# Patient Record
Sex: Female | Born: 1980
Health system: Southern US, Community
[De-identification: ages and names within clinical notes are randomized; demographics above are authoritative.]

## PROBLEM LIST (undated history)

## (undated) DIAGNOSIS — I10 Essential (primary) hypertension: Secondary | ICD-10-CM

## (undated) DIAGNOSIS — R1900 Intra-abdominal and pelvic swelling, mass and lump, unspecified site: Secondary | ICD-10-CM

## (undated) DIAGNOSIS — K760 Fatty (change of) liver, not elsewhere classified: Secondary | ICD-10-CM

## (undated) HISTORY — DX: Intra-abdominal and pelvic swelling, mass and lump, unspecified site: R19.00

## (undated) HISTORY — PX: DIAGNOSTIC LAPAROSCOPY: SUR761

## (undated) HISTORY — DX: Essential (primary) hypertension: I10

## (undated) HISTORY — DX: Fatty (change of) liver, not elsewhere classified: K76.0

---

## 2015-02-08 DIAGNOSIS — R1900 Intra-abdominal and pelvic swelling, mass and lump, unspecified site: Secondary | ICD-10-CM

## 2015-02-08 HISTORY — DX: Intra-abdominal and pelvic swelling, mass and lump, unspecified site: R19.00

## 2015-02-08 LAB — HM PAP SMEAR: HM Pap smear: NORMAL

## 2016-10-17 DIAGNOSIS — N390 Urinary tract infection, site not specified: Secondary | ICD-10-CM | POA: Diagnosis not present

## 2016-10-28 ENCOUNTER — Encounter: Payer: Self-pay | Admitting: Family

## 2016-10-28 ENCOUNTER — Ambulatory Visit (INDEPENDENT_AMBULATORY_CARE_PROVIDER_SITE_OTHER): Payer: 59 | Admitting: Family

## 2016-10-28 VITALS — BP 119/78 | HR 62 | Temp 98.2°F | Resp 16 | Ht <= 58 in | Wt 150.8 lb

## 2016-10-28 DIAGNOSIS — I10 Essential (primary) hypertension: Secondary | ICD-10-CM

## 2016-10-28 DIAGNOSIS — Z0000 Encounter for general adult medical examination without abnormal findings: Secondary | ICD-10-CM | POA: Diagnosis not present

## 2016-10-28 DIAGNOSIS — L309 Dermatitis, unspecified: Secondary | ICD-10-CM | POA: Insufficient documentation

## 2016-10-28 HISTORY — DX: Essential (primary) hypertension: I10

## 2016-10-28 HISTORY — DX: Dermatitis, unspecified: L30.9

## 2016-10-28 LAB — LIPID PANEL
CHOL/HDL RATIO: 3
Cholesterol: 196 mg/dL (ref 0–200)
HDL: 67.4 mg/dL (ref >39.00)
LDL CALC: 102 mg/dL — AB (ref 0–99)
NonHDL: 128.34
TRIGLYCERIDES: 130 mg/dL (ref 0.0–149.0)
VLDL: 26 mg/dL (ref 0.0–40.0)

## 2016-10-28 LAB — HEPATIC FUNCTION PANEL
ALT: 53 U/L — ABNORMAL HIGH (ref 0–35)
AST: 37 U/L (ref 0–37)
Albumin: 4.4 g/dL (ref 3.5–5.2)
Alkaline Phosphatase: 81 U/L (ref 39–117)
BILIRUBIN DIRECT: 0.1 mg/dL (ref 0.0–0.3)
BILIRUBIN TOTAL: 0.5 mg/dL (ref 0.2–1.2)
Total Protein: 7.8 g/dL (ref 6.0–8.3)

## 2016-10-28 LAB — URINALYSIS, ROUTINE W REFLEX MICROSCOPIC
Bilirubin Urine: NEGATIVE
HGB URINE DIPSTICK: NEGATIVE
Ketones, ur: NEGATIVE
Leukocytes, UA: NEGATIVE
Nitrite: NEGATIVE
RBC / HPF: NONE SEEN (ref 0–?)
Specific Gravity, Urine: 1.025 (ref 1.000–1.030)
Total Protein, Urine: NEGATIVE
Urine Glucose: NEGATIVE
Urobilinogen, UA: 0.2 (ref 0.0–1.0)
pH: 6 (ref 5.0–8.0)

## 2016-10-28 LAB — CBC WITH DIFFERENTIAL/PLATELET
BASOS PCT: 0.7 % (ref 0.0–3.0)
Basophils Absolute: 0 10*3/uL (ref 0.0–0.1)
EOS ABS: 0.2 10*3/uL (ref 0.0–0.7)
EOS PCT: 2.8 % (ref 0.0–5.0)
HEMATOCRIT: 40.1 % (ref 36.0–46.0)
Hemoglobin: 12.2 g/dL (ref 12.0–15.0)
LYMPHS PCT: 25.9 % (ref 12.0–46.0)
Lymphs Abs: 1.7 10*3/uL (ref 0.7–4.0)
MCHC: 30.5 g/dL (ref 30.0–36.0)
MCV: 65.4 fl — ABNORMAL LOW (ref 78.0–100.0)
Monocytes Absolute: 0.4 10*3/uL (ref 0.1–1.0)
Monocytes Relative: 6.2 % (ref 3.0–12.0)
NEUTROS ABS: 4.3 10*3/uL (ref 1.4–7.7)
Neutrophils Relative %: 64.4 % (ref 43.0–77.0)
PLATELETS: 305 10*3/uL (ref 150.0–400.0)
RBC: 6.13 Mil/uL — ABNORMAL HIGH (ref 3.87–5.11)
RDW: 14.7 % (ref 11.5–15.5)
WBC: 6.7 10*3/uL (ref 4.0–10.5)

## 2016-10-28 LAB — BASIC METABOLIC PANEL
BUN: 13 mg/dL (ref 6–23)
CHLORIDE: 103 meq/L (ref 96–112)
CO2: 28 meq/L (ref 19–32)
CREATININE: 0.72 mg/dL (ref 0.40–1.20)
Calcium: 9.9 mg/dL (ref 8.4–10.5)
GFR: 96.13 mL/min (ref >60.00)
Glucose, Bld: 93 mg/dL (ref 70–99)
POTASSIUM: 3.5 meq/L (ref 3.5–5.1)
Sodium: 139 mEq/L (ref 135–145)

## 2016-10-28 LAB — TSH: TSH: 2.35 u[IU]/mL (ref 0.35–4.50)

## 2016-10-28 MED ORDER — AMLODIPINE BESYLATE 5 MG PO TABS
5.0000 mg | ORAL_TABLET | Freq: Every day | ORAL | 1 refills | Status: DC
Start: 2016-10-28 — End: 2017-03-27

## 2016-10-28 MED ORDER — BETAMETHASONE DIPROPIONATE 0.05 % EX CREA: TOPICAL_CREAM | Freq: Two times a day (BID) | CUTANEOUS | 1 refills | Status: DC

## 2016-10-28 MED ORDER — CARVEDILOL 12.5 MG PO TABS: 12.5000 mg | ORAL_TABLET | Freq: Two times a day (BID) | ORAL | 1 refills | Status: DC

## 2016-10-28 MED FILL — BETAMETHASONE DP 0.05% CRM: 0.05 | 30 days supply | Qty: 30 | Fill #0

## 2016-10-28 NOTE — Assessment & Plan Note (Signed)
Advised good emollient such as aquaphor, rx diprolene bid as needed.

## 2016-10-28 NOTE — Progress Notes (Signed)
Subjective:    Patient ID: Bridget Rodriguez, female    DOB: Jul 15, 1980, 36 y.o.   MRN: [id number]  HPI   Moved from [place] 2 months ago.   HTN-  Reports that she has been on antihypertives x 3 years.   BP Readings from Last 3 Encounters:  10/28/16 119/78   Patient presents today for complete physical.  Immunizations: 2017- will get records for [place] Diet:reports diet is fair.  Exercise:  Very little Pap Smear: 2017 Vision:  2017 Dental:  Due   Wt Readings from Last 3 Encounters:  10/28/16 150 lb 12.8 oz (68.4 kg)   Atopic dermatitis-  Notes hands are itches.   Review of Systems  Constitutional: Negative for unexpected weight change.  HENT: Negative for hearing loss and rhinorrhea.   Eyes: Negative for visual disturbance.  Respiratory: Negative for cough.   Cardiovascular: Negative for leg swelling.  Gastrointestinal: Negative for blood in stool, constipation and diarrhea.  Genitourinary: Negative for dysuria, frequency and hematuria.  Musculoskeletal: Negative for arthralgias and myalgias.  Skin: Negative for rash.  Neurological: Negative for headaches.  Hematological: Negative for adenopathy.  Psychiatric/Behavioral:       Denies depression/anxiety   Past Medical History:  Diagnosis Date  . Hypertension   . Pelvic mass 2017   Benign pelvic floor mass per pt.     Social History   Social History  . Marital status: Married    Spouse name: N/A  . Number of children: N/A  . Years of education: N/A   Occupational History  . Not on file.   Social History Main Topics  . Smoking status: Never Smoker  . Smokeless tobacco: Never Used  . Alcohol use No  . Drug use: No  . Sexual activity: Not on file   Other Topics Concern  . 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     Past  Surgical History:  Procedure Laterality Date  . CESAREAN SECTION  2017  . CESAREAN SECTION  2019    Family History  Problem Relation Age of Onset  . Hypertension Father     No Known Allergies  No current outpatient prescriptions on file prior to visit.   No current facility-administered medications on file prior to visit.     BP 119/78 (BP Location: Right Arm, Cuff Size: Normal)   Pulse 62   Temp 98.2 F (36.8 C) (Oral)   Resp 16   Ht  (1.473 m)   Wt 150 lb 12.8 oz (68.4 kg)   LMP 10/25/2016   SpO2 98%   BMI 31.52 kg/m       Objective:   Physical Exam  Physical Exam  Constitutional: She is oriented to person, place, and time. She appears well-developed and well-nourished. No distress.  HENT:  Head: Normocephalic and atraumatic.  Right Ear: Tympanic membrane and ear canal normal.  Left Ear: Tympanic membrane and ear canal normal.  Mouth/Throat: Oropharynx is clear and moist.  Eyes: Pupils are equal, round, and reactive to light. No scleral icterus.  Neck: Normal range of motion. No thyromegaly present.  Cardiovascular: Normal rate and regular rhythm.   No murmur heard. Pulmonary/Chest: Effort normal and breath sounds normal. No respiratory distress. He has no wheezes. She  has no rales. She exhibits no tenderness.  Abdominal: Soft. Bowel sounds are normal. She exhibits no distension and no mass. There is no tenderness. There is no rebound and no guarding.  Musculoskeletal: She exhibits no edema.  Lymphadenopathy:    She has no cervical adenopathy.  Neurological: She is alert and oriented to person, place, and time. She has normal patellar reflexes. She exhibits normal muscle tone. Coordination normal.  Skin: Skin is warm and dry. dry skin noted on bilateral palms/fingers Psychiatric: She has a normal mood and affect. Her behavior is normal. Judgment and thought content normal.            Assessment & Plan:   Preventative care- discussed healthy diet,  exercise.  Obtain routine lab work.  Pap and tetanus up to date.  Will get flu shot from employer.       Assessment & Plan:

## 2016-10-28 NOTE — Assessment & Plan Note (Signed)
Controlled on current meds. Continue same.

## 2016-10-28 NOTE — Patient Instructions (Addendum)
Please complete lab work prior to leaving.  Work on healthy diet, exercise and weight loss.  Welcome to [facility]! 

## 2016-10-30 ENCOUNTER — Telehealth: Payer: Self-pay | Admitting: Family

## 2016-10-30 DIAGNOSIS — R7989 Other specified abnormal findings of blood chemistry: Secondary | ICD-10-CM

## 2016-10-30 DIAGNOSIS — R945 Abnormal results of liver function studies: Secondary | ICD-10-CM

## 2016-10-30 NOTE — Telephone Encounter (Signed)
Of her liver tests is elevated.  I would like her to complete abd [place] and additional lab work as pended. Also, possible iron deficiency so I will check iron level.

## 2016-10-31 NOTE — Telephone Encounter (Signed)
Left detailed message on pt's voicemail to call to schedule u/s and labs. Awaiting callback.

## 2016-11-07 NOTE — Telephone Encounter (Signed)
Attempted to reach pt and left detailed message that we also need to schedule a lab appt for the same day that she comes in for her u/s and to call us back to schedule the lab appt. Orders signed.

## 2016-11-07 NOTE — Telephone Encounter (Signed)
Patient is calling to schedule [place], order states Pending. Imaging is unable to schedule. Please advise

## 2016-11-08 ENCOUNTER — Other Ambulatory Visit (INDEPENDENT_AMBULATORY_CARE_PROVIDER_SITE_OTHER): Payer: 59

## 2016-11-08 DIAGNOSIS — R7989 Other specified abnormal findings of blood chemistry: Secondary | ICD-10-CM

## 2016-11-08 DIAGNOSIS — R945 Abnormal results of liver function studies: Secondary | ICD-10-CM | POA: Diagnosis not present

## 2016-11-09 LAB — IRON,TIBC AND FERRITIN PANEL
%SAT: 31 % (calc) (ref 11–50)
Ferritin: 70 ng/mL (ref 10–154)
IRON: 123 ug/dL (ref 40–190)
TIBC: 399 mcg/dL (calc) (ref 250–450)

## 2016-11-09 LAB — HEPATITIS PANEL, ACUTE
HEP A IGM: NONREACTIVE
HEP B S AG: NONREACTIVE
HEP C AB: NONREACTIVE
Hep B C IgM: NONREACTIVE
SIGNAL TO CUT-OFF: 0.02 (ref <1.00)

## 2016-11-15 ENCOUNTER — Ambulatory Visit (HOSPITAL_BASED_OUTPATIENT_CLINIC_OR_DEPARTMENT_OTHER)
Admission: RE | Admit: 2016-11-15 | Discharge: 2016-11-15 | Disposition: A | Discharge status: Home / Self Care | Payer: 59 | Source: Ambulatory Visit | Attending: Family | Admitting: Family

## 2016-11-15 ENCOUNTER — Telehealth: Payer: Self-pay | Admitting: Family

## 2016-11-15 ENCOUNTER — Encounter: Payer: Self-pay | Admitting: Family

## 2016-11-15 DIAGNOSIS — K824 Cholesterolosis of gallbladder: Secondary | ICD-10-CM | POA: Diagnosis not present

## 2016-11-15 DIAGNOSIS — R945 Abnormal results of liver function studies: Secondary | ICD-10-CM | POA: Insufficient documentation

## 2016-11-15 DIAGNOSIS — K760 Fatty (change of) liver, not elsewhere classified: Secondary | ICD-10-CM | POA: Insufficient documentation

## 2016-11-15 DIAGNOSIS — R7989 Other specified abnormal findings of blood chemistry: Secondary | ICD-10-CM | POA: Diagnosis not present

## 2016-11-15 NOTE — Telephone Encounter (Signed)
US shows fatty liver.  Work on low fat/low cholesterol diet/exercise.  Hepatitis testing is negative.

## 2016-11-16 NOTE — Telephone Encounter (Signed)
Left detailed message on pt's voicemail and to call if any questions. 

## 2016-11-25 ENCOUNTER — Encounter: Payer: Self-pay | Admitting: Family

## 2017-03-24 ENCOUNTER — Encounter: Payer: Self-pay | Admitting: Family

## 2017-03-27 MED ORDER — AMLODIPINE BESYLATE 5 MG PO TABS: 5.0000 mg | ORAL_TABLET | Freq: Every day | ORAL | 0 refills | Status: DC

## 2017-03-27 MED ORDER — CARVEDILOL 12.5 MG PO TABS: 12.5000 mg | ORAL_TABLET | Freq: Two times a day (BID) | ORAL | 0 refills | Status: DC

## 2017-03-31 ENCOUNTER — Ambulatory Visit (INDEPENDENT_AMBULATORY_CARE_PROVIDER_SITE_OTHER): Payer: 59 | Admitting: Family

## 2017-03-31 ENCOUNTER — Encounter: Payer: Self-pay | Admitting: Family

## 2017-03-31 VITALS — BP 123/70 | HR 55 | Temp 98.0°F | Resp 16 | Ht <= 58 in | Wt 149.8 lb

## 2017-03-31 DIAGNOSIS — K760 Fatty (change of) liver, not elsewhere classified: Secondary | ICD-10-CM | POA: Diagnosis not present

## 2017-03-31 DIAGNOSIS — I10 Essential (primary) hypertension: Secondary | ICD-10-CM

## 2017-03-31 MED ORDER — CARVEDILOL 12.5 MG PO TABS: 12.5000 mg | ORAL_TABLET | Freq: Two times a day (BID) | ORAL | 0 refills | Status: DC

## 2017-03-31 MED ORDER — AMLODIPINE BESYLATE 5 MG PO TABS: 5.0000 mg | ORAL_TABLET | Freq: Every day | ORAL | 0 refills | Status: DC

## 2017-03-31 NOTE — Patient Instructions (Addendum)
Please complete lab work prior to leaving.   

## 2017-03-31 NOTE — Progress Notes (Signed)
Subjective:    Patient ID: Bridget Rodriguez, female    DOB: 02/11/1980, 37 y.o.   MRN: [id number]  HPI   Ms. Bridget Rodriguez a 37 yr old female who presents today for follow up.   HTN- continues carvedilol or amlodipine.  Denies edema, CP/SOB.   BP Readings from Last 3 Encounters:  03/31/17 123/70  10/28/16 119/78   Fatty liver- noted on US. Began working on low fat diet 3 weeks ago.   Wt Readings from Last 3 Encounters:  03/31/17 149 lb 12.8 oz (67.9 kg)  10/28/16 150 lb 12.8 oz (68.4 kg)      Review of Systems    see HPI  Past Medical History:  Diagnosis Date  . Fatty liver   . Hypertension   . Pelvic mass 2017   Benign pelvic floor mass per pt.     Social History   Socioeconomic History  . Marital status: Married    Spouse name: Not on file  . Number of children: Not on file  . Years of education: Not on file  . Highest education level: Not on file  Social Needs  . Financial resource strain: Not on file  . Food insecurity - worry: Not on file  . Food insecurity - inability: Not on file  . Transportation needs - medical: Not on file  . Transportation needs - non-medical: Not on file  Occupational History  . Not on file  Tobacco Use  . Smoking status: Never Smoker  . Smokeless tobacco: Never Used  Substance and Sexual Activity  . Alcohol use: No  . Drug use: No  . Sexual activity: Yes    Birth control/protection: Surgical  Other Topics Concern  . 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     Past Surgical History:  Procedure Laterality Date  . CESAREAN SECTION  2009  . CESAREAN SECTION WITH BILATERAL TUBAL LIGATION Bilateral 2017    Family History  Problem Relation Age of Onset  . Hypertension Father     No Known Allergies  Current Outpatient Medications on File  Prior to Visit  Medication Sig Dispense Refill  . amLODipine (NORVASC) 5 MG tablet Take 1 tablet (5 mg total) by mouth daily. 90 tablet 0  . betamethasone dipropionate (DIPROLENE) 0.05 % cream Apply topically 2 (two) times daily. 30 g 1  . carvedilol (COREG) 12.5 MG tablet Take 1 tablet (12.5 mg total) by mouth 2 (two) times daily. 180 tablet 0   No current facility-administered medications on file prior to visit.     BP 123/70 (BP Location: Right Arm, Cuff Size: Normal)   Pulse (!) 55   Temp 98 F (36.7 C) (Oral)   Resp 16   Ht 4\' 10"  (1.473 m)   Wt 149 lb 12.8 oz (67.9 kg)   LMP 03/20/2017   SpO2 100%   BMI 31.31 kg/m    Objective:   Physical Exam  Constitutional: She is oriented to person, place, and time. She appears well-developed and well-nourished.  HENT:  Head: Normocephalic and atraumatic.  Cardiovascular: Normal rate, regular rhythm and normal heart sounds.  No murmur heard. Pulmonary/Chest: Effort normal and breath sounds normal. No respiratory distress. She has no wheezes.  Neurological: She is alert and oriented  to person, place, and time.  Psychiatric: She has a normal mood and affect. Her behavior is normal. Judgment and thought content normal.          Assessment & Plan:  HTN- BP stable. Continue current meds.   Fatty liver- discussed importance of low fat/low cholesterol diet, exercise and weight loss. She is working on this and is motivated to lose weight.

## 2017-04-27 ENCOUNTER — Telehealth: Payer: Self-pay | Admitting: Medical

## 2017-04-27 ENCOUNTER — Ambulatory Visit (INDEPENDENT_AMBULATORY_CARE_PROVIDER_SITE_OTHER): Payer: 59 | Admitting: Medical

## 2017-04-27 ENCOUNTER — Encounter: Payer: Self-pay | Admitting: Medical

## 2017-04-27 VITALS — BP 112/74 | HR 52 | Temp 98.2°F | Resp 16 | Ht <= 58 in | Wt 147.4 lb

## 2017-04-27 DIAGNOSIS — J029 Acute pharyngitis, unspecified: Secondary | ICD-10-CM | POA: Diagnosis not present

## 2017-04-27 DIAGNOSIS — J3489 Other specified disorders of nose and nasal sinuses: Secondary | ICD-10-CM

## 2017-04-27 DIAGNOSIS — R0981 Nasal congestion: Secondary | ICD-10-CM

## 2017-04-27 MED ORDER — AMOXICILLIN-POT CLAVULANATE 875-125 MG PO TABS: 1.0000 | ORAL_TABLET | Freq: Two times a day (BID) | ORAL | 0 refills | Status: DC

## 2017-04-27 MED ORDER — FLUTICASONE PROPIONATE 50 MCG/ACT NA SUSP: 2.0000 | Freq: Every day | NASAL | 1 refills | Status: DC

## 2017-04-27 NOTE — Patient Instructions (Addendum)
By exam and level of throat pain, I do have concern for strep throat.  Your rapid strep test was negative but rapid test can be falsely negative.  Also you do have some recent sinus pressure/pain over the past week.  I am prescribing Augmentin antibiotic.  This has adequate coverage for both sinus infections and strep throat.  For nasal congestion, I prescribed Flonase.  [place] your [place] in [place].  If when the pollen falls you have recurrent nasal congestion would recommend continue Flonase and get Xyzal over-the-counter.  Follow-up 7-10 days or as needed.

## 2017-04-27 NOTE — Telephone Encounter (Signed)
I took out patient's rapid strep ordered yesterday since it was not resulted and prevented me from closing the chart.  Please place order again and result it.  Thanks

## 2017-04-27 NOTE — Progress Notes (Signed)
Subjective:    Patient ID: Bridget Rodriguez, female    DOB: [birth year], 37 y.o.   MRN: [id number]  HPI  Pt in for st that started 2 days ago. At first had some stuffy nose and nasal congested. No sneezing or itching eyes. Some mild frontal ha and some sinus pressure.  Mild low grade fever.  Pt states hurts to swallow even her own saliva.   LMP- currently.    Review of Systems  Constitutional: Positive for fatigue and fever. Negative for chills.  HENT: Positive for congestion, sinus pressure and sore throat. Negative for ear pain, sinus pain, sneezing, trouble swallowing and voice change.   Respiratory: Negative for cough, chest tightness and wheezing.   Cardiovascular: Negative for chest pain.  Gastrointestinal: Negative for abdominal pain, constipation and diarrhea.  Musculoskeletal: Negative for back pain, gait problem and neck pain.  Skin: Negative for rash.  Neurological: Positive for headaches. Negative for dizziness and syncope.       Frontal sinus area.  Hematological: Positive for adenopathy.  Psychiatric/Behavioral: Negative for behavioral problems, confusion and sleep disturbance. The patient is not nervous/anxious.     Past Medical History:  Diagnosis Date  . Fatty liver   . Hypertension   . Pelvic mass 2017   Benign pelvic floor mass per pt.     Social History   Socioeconomic History  . Marital status: Married    Spouse name: Not on file  . Number of children: Not on file  . Years of education: Not on file  . Highest education level: Not on file  Occupational History  . Not on file  Social Needs  . Financial resource strain: Not on file  . Food insecurity:    Worry: Not on file    Inability: Not on file  . Transportation needs:    Medical: Not on file    Non-medical: Not on file  Tobacco Use  . Smoking status: Never Smoker  . Smokeless tobacco: Never Used  Substance and Sexual Activity  . Alcohol use: No  . Drug use: No  . Sexual activity:  Yes    Birth control/protection: Surgical  Lifestyle  . Physical activity:    Days per week: Not on file    Minutes per session: Not on file  . Stress: Not on file  Relationships  . Social connections:    Talks on phone: Not on file    Gets together: Not on file    Attends religious service: Not on file    Active member of club or organization: Not on file    Attends meetings of clubs or organizations: Not on file    Relationship status: Not on file  . Intimate partner violence:    Fear of current or ex partner: Not on file    Emotionally abused: Not on file    Physically abused: Not on file    Forced sexual activity: Not on file  Other Topics Concern  . 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     Past Surgical History:  Procedure Laterality Date  . CESAREAN SECTION  2009  . CESAREAN SECTION WITH BILATERAL TUBAL LIGATION Bilateral 2017    Family History  Problem Relation Age of Onset  .  Hypertension Father     No Known Allergies  Current Outpatient Medications on File Prior to Visit  Medication Sig Dispense Refill  . amLODipine (NORVASC) 5 MG tablet Take 1 tablet (5 mg total) by mouth daily. 90 tablet 0  . betamethasone dipropionate (DIPROLENE) 0.05 % cream Apply topically 2 (two) times daily. 30 g 1  . carvedilol (COREG) 12.5 MG tablet Take 1 tablet (12.5 mg total) by mouth 2 (two) times daily. 180 tablet 0   No current facility-administered medications on file prior to visit.     BP 112/74   Pulse (!) 52   Temp 98.2 F (36.8 C) (Oral)   Resp 16   Ht 4\' 10"  (1.473 m)   Wt 147 lb 6.4 oz (66.9 kg)   SpO2 100%   BMI 30.81 kg/m       Objective:   Physical Exam  General  Mental Status - Alert. General Appearance - Well groomed. Not in acute distress.  Skin Rashes- No  Rashes.  HEENT Head- Normal. Ear Auditory Canal - Left- Normal. Right - Normal.Tympanic Membrane- Left- Normal. Right- Normal. Eye Sclera/Conjunctiva- Left- Normal. Right- Normal. Nose & Sinuses Nasal Mucosa- Left-  Boggy and Congested. Right-  Boggy and  Congested.Bilateral maxillary and frontal sinus pressure. Mouth & Throat Lips: Upper Lip- Normal: no dryness, cracking, pallor, cyanosis, or vesicular eruption. Lower Lip-Normal: no dryness, cracking, pallor, cyanosis or vesicular eruption. Buccal Mucosa- Bilateral- No Aphthous ulcers. Oropharynx- No Discharge or Erythema. Tonsils: Characteristics- Bilateral- Erythema. Size/Enlargement- Bilateral- 1-2+ enlargement. Discharge- bilateral-None.  Neck Neck- Supple. No Masses.  Mild submandibular node inflammation and tenderness.   Chest and Lung Exam Auscultation: Breath Sounds:-Clear even and unlabored.  Cardiovascular Auscultation:Rythm- Regular, rate and rhythm. Murmurs & Other Heart Sounds:Ausculatation of the heart reveal- No Murmurs.  Lymphatic Head & Neck General Head & Neck Lymphatics: Bilateral: Description-  see neck exam.       Assessment & Plan:  By exam and level of throat pain, I do have concern for strep throat.  Your rapid strep test was negative but rapid test can be falsely negative.  Also you do have some recent sinus pressure/pain over the past week.  I am prescribing Augmentin antibiotic.  This has adequate coverage for both sinus infections and strep throat.  For nasal congestion, I prescribed Flonase.  This is your first year/spring season in [place].  If when the pollen falls you have recurrent nasal congestion would recommend continue Flonase and get Xyzal over-the-counter.  Follow-up 7-10 days or as needed.

## 2017-05-19 ENCOUNTER — Other Ambulatory Visit: Payer: Self-pay

## 2017-05-19 ENCOUNTER — Encounter (HOSPITAL_BASED_OUTPATIENT_CLINIC_OR_DEPARTMENT_OTHER): Payer: Self-pay

## 2017-05-19 ENCOUNTER — Emergency Department (HOSPITAL_BASED_OUTPATIENT_CLINIC_OR_DEPARTMENT_OTHER)
Admission: EM | Admit: 2017-05-19 | Discharge: 2017-05-20 | Disposition: A | Discharge status: Home / Self Care | Payer: 59 | Attending: Emergency Medicine | Admitting: Emergency Medicine

## 2017-05-19 DIAGNOSIS — I10 Essential (primary) hypertension: Secondary | ICD-10-CM | POA: Diagnosis not present

## 2017-05-19 DIAGNOSIS — R61 Generalized hyperhidrosis: Secondary | ICD-10-CM | POA: Diagnosis not present

## 2017-05-19 DIAGNOSIS — R42 Dizziness and giddiness: Secondary | ICD-10-CM | POA: Insufficient documentation

## 2017-05-19 DIAGNOSIS — Z79899 Other long term (current) drug therapy: Secondary | ICD-10-CM | POA: Diagnosis not present

## 2017-05-19 LAB — URINALYSIS, MICROSCOPIC (REFLEX)
BACTERIA UA: NONE SEEN
RBC / HPF: NONE SEEN RBC/hpf (ref 0–5)

## 2017-05-19 LAB — CBC WITH DIFFERENTIAL/PLATELET
BASOS ABS: 0 10*3/uL (ref 0.0–0.1)
Basophils Relative: 0 %
Eosinophils Absolute: 0.1 10*3/uL (ref 0.0–0.7)
Eosinophils Relative: 2 %
HCT: 38.9 % (ref 36.0–46.0)
HEMOGLOBIN: 12.8 g/dL (ref 12.0–15.0)
LYMPHS PCT: 26 %
Lymphs Abs: 1.8 10*3/uL (ref 0.7–4.0)
MCH: 20.3 pg — ABNORMAL LOW (ref 26.0–34.0)
MCHC: 32.9 g/dL (ref 30.0–36.0)
MCV: 61.7 fL — ABNORMAL LOW (ref 78.0–100.0)
MONOS PCT: 6 %
Monocytes Absolute: 0.4 10*3/uL (ref 0.1–1.0)
NEUTROS ABS: 4.6 10*3/uL (ref 1.7–7.7)
NEUTROS PCT: 66 %
Platelets: 311 10*3/uL (ref 150–400)
RBC: 6.3 MIL/uL — ABNORMAL HIGH (ref 3.87–5.11)
RDW: 16.5 % — ABNORMAL HIGH (ref 11.5–15.5)
WBC: 6.9 10*3/uL (ref 4.0–10.5)

## 2017-05-19 LAB — URINALYSIS, ROUTINE W REFLEX MICROSCOPIC
BILIRUBIN URINE: NEGATIVE
GLUCOSE, UA: NEGATIVE mg/dL
HGB URINE DIPSTICK: NEGATIVE
Ketones, ur: NEGATIVE mg/dL
Nitrite: NEGATIVE
PH: 7 (ref 5.0–8.0)
Protein, ur: NEGATIVE mg/dL
Specific Gravity, Urine: 1.015 (ref 1.005–1.030)

## 2017-05-19 LAB — PREGNANCY, URINE: PREG TEST UR: NEGATIVE

## 2017-05-19 LAB — CBG MONITORING, ED: Glucose-Capillary: 113 mg/dL — ABNORMAL HIGH (ref 65–99)

## 2017-05-19 MED ORDER — SODIUM CHLORIDE 0.9 % IV BOLUS
1000.0000 mL | Freq: Once | INTRAVENOUS | Status: AC
  Administered 2017-05-19: 1000 mL via INTRAVENOUS

## 2017-05-19 NOTE — ED Provider Notes (Signed)
[facility] EMERGENCY DEPARTMENT Provider Note   CSN: [id number] Arrival date & time: 05/19/17  2143  History   Chief Complaint Chief Complaint  Patient presents with  . Dizziness    HPI Bridget Rodriguez is a 37 y.o. female presenting with lightheadedness.  HPI   Patient presents with lightheadedness. Has accompanying clammy hands and diaphoresis, and also feels as if she is going to pass out. Occurred once 5 days ago and again tonight. Has not actually syncopized either time. Episodes last for 2-3 seconds. She has been sitting down when both episodes occurred. Feels as if the room is spinning around her. No N/V, no vision changes, no HA. Feels weak during event. Has an appt with her PCP soon but wanted to be checked out prior to appt since episode occurred again today. Denies chest pain, SOB, palpitations.   Past Medical History:  Diagnosis Date  . Fatty liver   . Hypertension   . Pelvic mass 2017   Benign pelvic floor mass per pt.    Patient Active Problem List   Diagnosis Date Noted  . Fatty liver   . Hypertension 10/28/2016  . Eczema 10/28/2016    Past Surgical History:  Procedure Laterality Date  . CESAREAN SECTION  2009  . CESAREAN SECTION WITH BILATERAL TUBAL LIGATION Bilateral 2017     OB History   None      Home Medications    Prior to Admission medications   Medication Sig Start Date End Date Taking? Authorizing Provider  amLODipine (NORVASC) 5 MG tablet Take 1 tablet (5 mg total) by mouth daily. 03/31/17  Yes Sandford Craze, NP  carvedilol (COREG) 12.5 MG tablet Take 1 tablet (12.5 mg total) by mouth 2 (two) times daily. 03/31/17  Yes Sandford Craze, NP    Family History Family History  Problem Relation Age of Onset  . Hypertension Father     Social History Social History   Tobacco Use  . Smoking status: Never Smoker  . Smokeless tobacco: Never Used  Substance Use Topics  . Alcohol use: No  . Drug use: No      Allergies   Patient has no known allergies.   Review of Systems Review of Systems  Constitutional: Negative for fever.  Eyes: Negative for visual disturbance.  Respiratory: Negative for shortness of breath.   Cardiovascular: Negative for chest pain.  Gastrointestinal: Negative for nausea and vomiting.  Neurological: Positive for light-headedness.   Physical Exam Updated Vital Signs BP 131/87 (BP Location: Right Arm)   Pulse 64   Temp 98.1 F (36.7 C) (Oral)   Resp 18   Ht 4\' 10"  (1.473 m)   Wt 66.7 kg (147 lb)   LMP 04/24/2017   SpO2 97%   BMI 30.72 kg/m   Physical Exam  Constitutional: She is oriented to person, place, and time. She appears well-developed and well-nourished.  Sitting up in bed in NAD  HENT:  Head: Normocephalic and atraumatic.  Nose: Nose normal.  Mouth/Throat: Oropharynx is clear and moist. No oropharyngeal exudate.  Eyes: Pupils are equal, round, and reactive to light. Conjunctivae and EOM are normal. Right eye exhibits no discharge. Left eye exhibits no discharge.  Neck: Normal range of motion. Neck supple.  Cardiovascular: Normal rate, regular rhythm and normal heart sounds.  No murmur heard. Pulmonary/Chest: Effort normal and breath sounds normal. No respiratory distress. She has no wheezes.  Abdominal: Soft. Bowel sounds are normal. She exhibits no distension. There is no tenderness.  Musculoskeletal: Normal range of motion. She exhibits no edema or tenderness.  Lymphadenopathy:    She has no cervical adenopathy.  Neurological: She is alert and oriented to person, place, and time. No cranial nerve deficit. She exhibits normal muscle tone. Coordination normal.  Skin: Skin is warm and dry.  Psychiatric: She has a normal mood and affect. Her behavior is normal.  Nursing note and vitals reviewed.    ED Treatments / Results  Labs (all labs ordered are listed, but only abnormal results are displayed) Labs Reviewed  URINALYSIS, ROUTINE W  REFLEX MICROSCOPIC - Abnormal; Notable for the following components:      Result Value   Color, Urine COLORLESS (*)    Leukocytes, UA TRACE (*)    All other components within normal limits  CBC WITH DIFFERENTIAL/PLATELET - Abnormal; Notable for the following components:   RBC 6.30 (*)    MCV 61.7 (*)    MCH 20.3 (*)    RDW 16.5 (*)    All other components within normal limits  URINALYSIS, MICROSCOPIC (REFLEX) - Abnormal; Notable for the following components:   Squamous Epithelial / LPF 0-5 (*)    All other components within normal limits  CBG MONITORING, ED - Abnormal; Notable for the following components:   Glucose-Capillary 113 (*)    All other components within normal limits  PREGNANCY, URINE  BASIC METABOLIC PANEL  TSH  T4    EKG EKG Interpretation  Date/Time:  Friday May 19 2017 21:55:29 EDT Ventricular Rate:  69 PR Interval:  164 QRS Duration: 82 QT Interval:  394 QTC Calculation: 422 R Axis:   73 Text Interpretation:  Normal sinus rhythm Cannot rule out Anterior infarct , age undetermined Artifact No previous tracing Confirmed by Gwyneth SproutPlunkett, Whitney ([id number]) on 05/19/2017 10:35:47 PM   Radiology No results found.  Procedures Procedures (including critical care time)  Medications Ordered in ED Medications  sodium chloride 0.9 % bolus 1,000 mL (has no administration in time range)     Initial Impression / Assessment and Plan / ED Course  I have reviewed the triage vital signs and the nursing notes.  Pertinent labs & imaging results that were available during my care of the patient were reviewed by me and considered in my medical decision making (see chart for details).     2252 Patient presenting with two brief 3 second episodes of lightheadedness (one 5 days ago, one tonight). Well-appearing on exam with normal neuro exam. EKG NSR. Less likely vertigo, as episodes so brief and no associated nausea or vomiting. Less likely cardiac etiology, given normal EKG,  denial of chest pain or SOB, as well as normal cardiac exam. Unlikely orthostasis, as patient sitting down during both episodes, and BP hyper- rather than hypotensive today. Will check CBG and CBC to ensure not hypoglycemic or severely anemic. UA and Upreg also obtained.   2322 CBG 113, so hypoglycemia less likely cause. Upreg neg. Trace leuks but no other abnormalities on UA. Still awaiting CBC.    2336 Hgb NL at 12.8 Patient very diaphoretic on recheck. Says episodes have been coming and going throughout her time in ED. Will give 1L IVF bolus and check BMP and thyroid panel. Signed out to night team.   Final Clinical Impressions(s) / ED Diagnoses   Final diagnoses:  Lightheadedness    ED Discharge Orders    None     Tarri AbernethyAbigail J Melonie Germani, MD, MPH PGY-3 [facility] Pager [phone]    Brayton CavesLancaster, Mahati Vajda  Jomarie Longs, MD 05/19/17 1610    Gwyneth Sprout, MD 05/20/17 2102

## 2017-05-19 NOTE — ED Notes (Signed)
ED Provider at bedside. 

## 2017-05-19 NOTE — Discharge Instructions (Addendum)
Please be sure to follow up with your regular doctor as planned.   If your lightheadedness worsens, or if you develop chest pain or trouble breathing, please return to the emergency room.

## 2017-05-19 NOTE — ED Triage Notes (Signed)
Pt c/o episode of feeling light headed "about to pass out"-once on 4/7 and again 30 min PTA-NAD-to triage in w/c

## 2017-05-20 DIAGNOSIS — R42 Dizziness and giddiness: Secondary | ICD-10-CM | POA: Diagnosis not present

## 2017-05-20 DIAGNOSIS — I10 Essential (primary) hypertension: Secondary | ICD-10-CM | POA: Diagnosis not present

## 2017-05-20 DIAGNOSIS — Z79899 Other long term (current) drug therapy: Secondary | ICD-10-CM | POA: Diagnosis not present

## 2017-05-20 LAB — BASIC METABOLIC PANEL
ANION GAP: 8 (ref 5–15)
BUN: 15 mg/dL (ref 6–20)
CALCIUM: 9.1 mg/dL (ref 8.9–10.3)
CO2: 23 mmol/L (ref 22–32)
CREATININE: 0.61 mg/dL (ref 0.44–1.00)
Chloride: 107 mmol/L (ref 101–111)
GFR calc Af Amer: >60 mL/min (ref >60)
GLUCOSE: 103 mg/dL — AB (ref 65–99)
Potassium: 3.7 mmol/L (ref 3.5–5.1)
Sodium: 138 mmol/L (ref 135–145)

## 2017-05-20 LAB — TSH: TSH: 1.082 u[IU]/mL (ref 0.350–4.500)

## 2017-05-20 NOTE — ED Notes (Signed)
Pt verbalizes understanding of d/c instructions and denies any further needs at this time. 

## 2017-05-20 NOTE — ED Provider Notes (Signed)
Care was taken over from the prior care team.  Patient is awaiting lab work.  Her labs are reassuring.  Her EKG does not show any arrhythmias or ischemic changes.  Her vital signs are non-concerning.  She has no hypotension.  No tachycardia although she is on a beta-blocker.  She was given IV fluids and states that she feels 100% better.  She denies any ongoing symptoms.  She was previously getting diaphoretic but denies any clamminess or diaphoresis.  No chest pain or shortness of breath.  She was able to ambulate without symptoms.  No lightheadedness.  No shortness of breath.  No other symptoms that would be more concerning for pulmonary embolus.  She has no symptoms that would be more concerning for acute coronary syndrome.  She was encouraged to have close follow-up with her PCP.  Return precautions were given.   Rolan BuccoBelfi, Dontre Laduca, MD 05/20/17 [phone]

## 2017-05-20 NOTE — ED Notes (Addendum)
Pt ambulated in hall to bathroom without assistance or difficulty.

## 2017-05-21 LAB — T4: T4, Total: 8.8 ug/dL (ref 4.5–12.0)

## 2017-05-24 ENCOUNTER — Ambulatory Visit: Payer: 59 | Admitting: Medical

## 2017-05-25 ENCOUNTER — Encounter: Payer: Self-pay | Admitting: Medical

## 2017-05-25 ENCOUNTER — Ambulatory Visit (INDEPENDENT_AMBULATORY_CARE_PROVIDER_SITE_OTHER): Payer: 59 | Admitting: Medical

## 2017-05-25 VITALS — BP 119/79 | HR 59 | Resp 16 | Ht <= 58 in | Wt 148.2 lb

## 2017-05-25 DIAGNOSIS — N926 Irregular menstruation, unspecified: Secondary | ICD-10-CM

## 2017-05-25 DIAGNOSIS — R42 Dizziness and giddiness: Secondary | ICD-10-CM

## 2017-05-25 DIAGNOSIS — R739 Hyperglycemia, unspecified: Secondary | ICD-10-CM

## 2017-05-25 DIAGNOSIS — R232 Flushing: Secondary | ICD-10-CM

## 2017-05-25 DIAGNOSIS — R5383 Other fatigue: Secondary | ICD-10-CM

## 2017-05-25 MED ORDER — MECLIZINE HCL 12.5 MG PO TABS: 12.5000 mg | ORAL_TABLET | Freq: Three times a day (TID) | ORAL | 0 refills | Status: DC | PRN

## 2017-05-25 NOTE — Patient Instructions (Signed)
For your recent episodes of dizziness and lightheadedness with some mild fatigue, I do think it would be a good idea to get B12, B1 and vitamin D level.  Labs in the emergency department did not show any anemia, electrolyte abnormality or thyroid abnormalities.  With irregular menses at times over the past year and intermittent hot/sensation/sweating, [facility]  You had recent mild high sugar in the emergency department.  We will get A1c today.  You have felt well since discharge from the emergency department with no recurrent symptoms.  If you do get recurrent dizziness/lightheadedness then would recommend checking blood pressure, pulse and include sugar check as well.  Particularly if these events occur while you are working.  Try to stay well-hydrated.  If you have dizziness that lasts for more than 5 minutes or more then you could use meclizine.  But if brief and transient then would not recommend meclizine.  Signs and symptoms reviewed today that would indicate need for ED evaluation.  I do not think imaging of head or referral to ENT necessary at this point.  Follow-up in 3 weeks or as needed.

## 2017-05-25 NOTE — Progress Notes (Signed)
Subjective:    Patient ID: Bridget Rodriguez, female    DOB: [birth year], 37 y.o.   MRN: [id number]  HPI  Pt in for follow up from the ED.  Pt has had some work up for light headed/dizziness episodes. She states last Sunday had episode of dizziness that last for about 10 minutes. Felt very weak like almost was about to pass out. She sat down and rested. Symptoms then resolved. Then last Friday she had another similar episode that last for same duration. Second time she went to ED and work up was negative.  No preceding palpitation, no chest pain, no sob, or nausea/ vomiting.   Pt states no uri type symptoms or allergy symptoms.  Pt had ekg done and looked ok. Tsh was normal.  Sugar level was mild elevated at 103 in ED.  No anemia. Pregnancy test was negative in ED. Pt states lmp 04-24-2017. Over last year her cycles may be more spread out. Occasional skips cycle. 3 times over past year. Some hot flash sensation at times.  Since discharge from ED no recurrent episodes.  Pt was given iv fluids in ED and she states felt better  Pt does report some fatigue at times but not severe.One of her dizziness episodes happened during work. Other happened after consecutive days not working.  Pt when she checks her bp 115-125 sytsolic. Diastolic 85-95   Review of Systems  Constitutional: Positive for fatigue.       Occasional fatigue.  HENT: Positive for congestion. Negative for ear pain, mouth sores, nosebleeds, postnasal drip, rhinorrhea, sinus pressure and sinus pain.        Faint congestion.  Respiratory: Negative for cough, chest tightness, shortness of breath and wheezing.   Cardiovascular: Negative for chest pain and palpitations.  Endocrine:       Some months she will skip cycles.   Some random hot flash sensation and sweating intermittent over past year.  Genitourinary: Negative for decreased urine volume, difficulty urinating, dysuria, flank pain, frequency, urgency and vaginal  pain.  Neurological: Negative for dizziness and headaches.  Hematological: Negative for adenopathy. Does not bruise/bleed easily.  Psychiatric/Behavioral: Negative for behavioral problems and confusion.       Some stress at work.    Past Medical History:  Diagnosis Date  . Fatty liver   . Hypertension   . Pelvic mass 2017   Benign pelvic floor mass per pt.     Social History   Socioeconomic History  . Marital status: Married    Spouse name: Not on file  . Number of children: Not on file  . Years of education: Not on file  . Highest education level: Not on file  Occupational History  . Not on file  Social Needs  . Financial resource strain: Not on file  . Food insecurity:    Worry: Not on file    Inability: Not on file  . Transportation needs:    Medical: Not on file    Non-medical: Not on file  Tobacco Use  . Smoking status: Never Smoker  . Smokeless tobacco: Never Used  Substance and Sexual Activity  . Alcohol use: No  . Drug use: No  . Sexual activity: Yes    Birth control/protection: Surgical  Lifestyle  . Physical activity:    Days per week: Not on file    Minutes per session: Not on file  . Stress: Not on file  Relationships  . Social connections:    Talks on  phone: Not on file    Gets together: Not on file    Attends religious service: Not on file    Active member of club or organization: Not on file    Attends meetings of clubs or organizations: Not on file    Relationship status: Not on file  . Intimate partner violence:    Fear of current or ex partner: Not on file    Emotionally abused: Not on file    Physically abused: Not on file    Forced sexual activity: Not on file  Other Topics Concern  . 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     Past Surgical History:    Procedure Laterality Date  . CESAREAN SECTION  2009  . CESAREAN SECTION WITH BILATERAL TUBAL LIGATION Bilateral 2017    Family History  Problem Relation Age of Onset  . Hypertension Father     No Known Allergies  Current Outpatient Medications on File Prior to Visit  Medication Sig Dispense Refill  . amLODipine (NORVASC) 5 MG tablet Take 1 tablet (5 mg total) by mouth daily. 90 tablet 0  . carvedilol (COREG) 12.5 MG tablet Take 1 tablet (12.5 mg total) by mouth 2 (two) times daily. 180 tablet 0   No current facility-administered medications on file prior to visit.     BP 108/72 (BP Location: Right Arm, Patient Position: Sitting, Cuff Size: Normal)   Pulse (!) 59   Resp 16   Ht 4\' 10"  (1.473 m)   Wt 148 lb 3.2 oz (67.2 kg)   SpO2 100%   BMI 30.97 kg/m       Objective:   Physical Exam  General  Mental Status - Alert. General Appearance - Well groomed. Not in acute distress.  Skin Rashes- No Rashes.  HEENT Head- Normal. Ear Auditory Canal - Left- Normal. Right - Normal.Tympanic Membrane- Left- Normal. Right- Normal. Eye Sclera/Conjunctiva- Left- Normal. Right- Normal. Nose & Sinuses Nasal Mucosa- Left-  Boggy and Congested. Right-  Boggy and  Congested.Bilateral no  maxillary and  No frontal sinus pressure. Mouth & Throat Lips: Upper Lip- Normal: no dryness, cracking, pallor, cyanosis, or vesicular eruption. Lower Lip-Normal: no dryness, cracking, pallor, cyanosis or vesicular eruption. Buccal Mucosa- Bilateral- No Aphthous ulcers. Oropharynx- No Discharge or Erythema. Tonsils: Characteristics- Bilateral- No Erythema or Congestion. Size/Enlargement- Bilateral- No enlargement. Discharge- bilateral-None.  Neck Neck- Supple. No Masses.   Chest and Lung Exam Auscultation: Breath Sounds:-Clear even and unlabored.  Cardiovascular Auscultation:Rythm- Regular, rate and rhythm. Murmurs & Other Heart Sounds:Ausculatation of the heart reveal- No  Murmurs.  Lymphatic Head & Neck General Head & Neck Lymphatics: Bilateral: Description- No Localized lymphadenopathy.   HEENT Head- Normal. Ear Auditory Canal - Left- Normal. Right - Normal.Tympanic Membrane- Left- Normal. Right- Normal. Eye Sclera/Conjunctiva- Left- Normal. Right- Normal. Nose & Sinuses Nasal Mucosa- Left-  Boggy and Congested. Right-  Boggy and  Congested.Bilateral no  maxillary and  No frontal sinus pressure. Mouth & Throat Lips: Upper Lip- Normal: no dryness, cracking, pallor, cyanosis, or vesicular eruption. Lower Lip-Normal: no dryness, cracking, pallor, cyanosis or vesicular eruption. Buccal Mucosa- Bilateral- No Aphthous ulcers. Oropharynx- No Discharge or Erythema. Tonsils: Characteristics- Bilateral- No Erythema or Congestion. Size/Enlargement- Bilateral- No enlargement. Discharge- bilateral-None.   Neurologic  Cranial Nerve exam:- CN III-XII intact(No nystagmus), symmetric smile. Strength:- 5/5 equal and symmetric strength both upper and lower extremities.           Assessment & Plan:  For your recent episodes of dizziness and lightheadedness with some mild fatigue, I do think it would be a good idea to get B12, B1 and vitamin D level.  Labs in the emergency department did not show any anemia, electrolyte abnormality or thyroid abnormalities.  With irregular menses at times over the past year and intermittent hot/sensation/sweating, I [facility]  You had recent mild high sugar in the emergency department.  We will get A1c today.  You have felt well since discharge from the emergency department with no recurrent symptoms.  If you do get recurrent dizziness/lightheadedness then would recommend checking blood pressure, pulse and include sugar check as well.  Particularly if these events occur while you are working.  Try to stay well-hydrated.  If you have dizziness that lasts for more than 5 minutes or more then you could use  meclizine.  But if brief and transient then would not recommend meclizine.  Signs and symptoms reviewed today that would indicate need for ED evaluation.  I do not think imaging of head or referral to ENT necessary at this point.  Did advise for possible allergies can use flonase otc.  Follow-up in 3 weeks or as needed.  Esperanza RichtersEdward Lucy Woolever, PA-C

## 2017-05-26 ENCOUNTER — Telehealth: Payer: Self-pay | Admitting: Medical

## 2017-05-26 MED ORDER — VITAMIN D (ERGOCALCIFEROL) 1.25 MG (50000 UNIT) PO CAPS: 50000.0000 [IU] | ORAL_CAPSULE | ORAL | 0 refills | Status: DC

## 2017-05-26 NOTE — Telephone Encounter (Signed)
rx of vitamin D sent to pt pharmacy

## 2017-05-28 LAB — VITAMIN B1: Vitamin B1 (Thiamine): 8 nmol/L (ref 8–30)

## 2017-05-28 LAB — HEMOGLOBIN A1C
HEMOGLOBIN A1C: 5.6 %{Hb} (ref <5.7)
MEAN PLASMA GLUCOSE: 114 (calc)
eAG (mmol/L): 6.3 (calc)

## 2017-05-28 LAB — VITAMIN B12: Vitamin B-12: 541 pg/mL (ref 200–1100)

## 2017-05-28 LAB — VITAMIN D 25 HYDROXY (VIT D DEFICIENCY, FRACTURES): Vit D, 25-Hydroxy: 15 ng/mL — ABNORMAL LOW (ref 30–100)

## 2017-05-28 LAB — FOLLICLE STIMULATING HORMONE: FSH: 11.9 m[IU]/mL

## 2017-06-05 ENCOUNTER — Ambulatory Visit: Payer: 59 | Admitting: Family

## 2017-09-29 ENCOUNTER — Encounter: Payer: 59 | Admitting: Family

## 2017-10-02 ENCOUNTER — Ambulatory Visit (INDEPENDENT_AMBULATORY_CARE_PROVIDER_SITE_OTHER): Payer: 59 | Admitting: Family

## 2017-10-02 ENCOUNTER — Encounter: Payer: Self-pay | Admitting: Family

## 2017-10-02 ENCOUNTER — Encounter: Payer: 59 | Admitting: Family

## 2017-10-02 VITALS — BP 121/82 | HR 57 | Temp 98.1°F | Resp 16 | Ht 58.5 in | Wt 149.6 lb

## 2017-10-02 DIAGNOSIS — Z0000 Encounter for general adult medical examination without abnormal findings: Secondary | ICD-10-CM | POA: Diagnosis not present

## 2017-10-02 DIAGNOSIS — I10 Essential (primary) hypertension: Secondary | ICD-10-CM | POA: Diagnosis not present

## 2017-10-02 DIAGNOSIS — E559 Vitamin D deficiency, unspecified: Secondary | ICD-10-CM

## 2017-10-02 MED ORDER — BETAMETHASONE DIPROPIONATE 0.05 % EX CREA: TOPICAL_CREAM | Freq: Two times a day (BID) | CUTANEOUS | 0 refills | Status: DC

## 2017-10-02 MED ORDER — CARVEDILOL 12.5 MG PO TABS: 12.5000 mg | ORAL_TABLET | Freq: Two times a day (BID) | ORAL | 1 refills | Status: DC

## 2017-10-02 MED ORDER — AMLODIPINE BESYLATE 5 MG PO TABS: 5.0000 mg | ORAL_TABLET | Freq: Every day | ORAL | 1 refills | Status: DC

## 2017-10-02 NOTE — Patient Instructions (Signed)
Please complete lab work prior to leaving. Continue to work on healthy diet, exercise and weight loss.  

## 2017-10-02 NOTE — Progress Notes (Signed)
Subjective:    Patient ID: Bridget Rodriguez, female    DOB: 03-12-1980, 37 y.o.   MRN: [id number]  HPI  Patient presents today for complete physical.  Immunizations: tdap 2018 Diet: trying to focus on low fat diet Exercise: walking Wt Readings from Last 3 Encounters:  10/02/17 149 lb 9.6 oz (67.9 kg)  05/25/17 148 lb 3.2 oz (67.2 kg)  05/19/17 147 lb (66.7 kg)   Pap Smear: 1/17- has GYN Mammogram: will begin at 38  HTN- continues carvedilol and amlodipine.  BP Readings from Last 3 Encounters:  10/02/17 121/82  05/25/17 119/79  05/20/17 (!) 112/92     Review of Systems  Constitutional: Negative for unexpected weight change.  HENT: Negative for hearing loss and rhinorrhea.   Eyes: Negative for visual disturbance.  Respiratory: Negative for cough and shortness of breath.   Cardiovascular: Negative for chest pain and leg swelling.  Gastrointestinal: Negative for blood in stool, constipation and diarrhea.  Genitourinary: Positive for menstrual problem. Negative for dysuria and frequency.  Musculoskeletal: Negative for arthralgias and myalgias.  Skin:       Mild hand eczema- worse in the winter  Neurological: Negative for headaches.  Hematological: Negative for adenopathy.  Psychiatric/Behavioral:       Denies depression/anxiety     Past Medical History:  Diagnosis Date  . Fatty liver   . Hypertension   . Pelvic mass 2017   Benign pelvic floor mass per pt.     Social History   Socioeconomic History  . Marital status: Married    Spouse name: Not on file  . Number of children: Not on file  . Years of education: Not on file  . Highest education level: Not on file  Occupational History  . Not on file  Social Needs  . Financial resource strain: Not on file  . Food insecurity:    Worry: Not on file    Inability: Not on file  . Transportation needs:    Medical: Not on file    Non-medical: Not on file  Tobacco Use  . Smoking status: Never Smoker  .  Smokeless tobacco: Never Used  Substance and Sexual Activity  . Alcohol use: No  . Drug use: No  . Sexual activity: Yes    Birth control/protection: Surgical  Lifestyle  . Physical activity:    Days per week: Not on file    Minutes per session: Not on file  . Stress: Not on file  Relationships  . Social connections:    Talks on phone: Not on file    Gets together: Not on file    Attends religious service: Not on file    Active member of club or organization: Not on file    Attends meetings of clubs or organizations: Not on file    Relationship status: Not on file  . Intimate partner violence:    Fear of current or ex partner: Not on file    Emotionally abused: Not on file    Physically abused: Not on file    Forced sexual activity: Not on file  Other Topics Concern  . 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     Past Surgical History:  Procedure Laterality Date  . CESAREAN SECTION  2009  . CESAREAN SECTION WITH BILATERAL TUBAL LIGATION Bilateral 2017    Family History  Problem Relation Age of Onset  . Hypertension Father     No Known Allergies  Current Outpatient Medications on File Prior to Visit  Medication Sig Dispense Refill  . amLODipine (NORVASC) 5 MG tablet Take 1 tablet (5 mg total) by mouth daily. 90 tablet 0  . carvedilol (COREG) 12.5 MG tablet Take 1 tablet (12.5 mg total) by mouth 2 (two) times daily. 180 tablet 0   No current facility-administered medications on file prior to visit.     BP 121/82 (BP Location: Right Arm, Cuff Size: Normal)   Pulse (!) 57   Temp 98.1 F (36.7 C) (Oral)   Resp 16   Ht 4' 10.5" (1.486 m)   Wt 149 lb 9.6 oz (67.9 kg)   LMP 08/24/2017   SpO2 100%   BMI 30.73 kg/m       Objective:   Physical Exam Physical Exam  Constitutional:  She is oriented to person, place, and time. She appears well-developed and well-nourished. No distress.  HENT:  Head: Normocephalic and atraumatic.  Right Ear: Tympanic membrane and ear canal normal.  Left Ear: Tympanic membrane and ear canal normal.  Mouth/Throat: Oropharynx is clear and moist.  Eyes: Pupils are equal, round, and reactive to light. No scleral icterus.  Neck: Normal range of motion. No thyromegaly present.  Cardiovascular: Normal rate and regular rhythm.   No murmur heard. Pulmonary/Chest: Effort normal and breath sounds normal. No respiratory distress. He has no wheezes. She has no rales. She exhibits no tenderness.  Abdominal: Soft. Bowel sounds are normal. She exhibits no distension and no mass. There is no tenderness. There is no rebound and no guarding.  Musculoskeletal: She exhibits no edema.  Lymphadenopathy:    She has no cervical adenopathy.  Neurological: She is alert and oriented to person, place, and time. She has normal patellar reflexes. She exhibits normal muscle tone. Coordination normal.  Skin: Skin is warm and dry.  Psychiatric: She has a normal mood and affect. Her behavior is normal. Judgment and thought content normal.  Breasts: Examined lying Right: Without masses, retractions, discharge or axillary adenopathy.  Left: Without masses, retractions, discharge or axillary adenopathy.    Pelvic: deferred to GYN  Assessment & Plan:   Preventative care- discussed healthy diet, exercise, weight loss. Pap up to date. Plan to begin mammograms at age 78.   HTN- bp stable, continue current meds.   Vit D deficiency- did  Not take weekly 50000 unit dosing, took otc chewable vit d instead. Obtain follow up vit d level.     Assessment & Plan:

## 2017-10-03 LAB — URINALYSIS, ROUTINE W REFLEX MICROSCOPIC
Bilirubin Urine: NEGATIVE
Hgb urine dipstick: NEGATIVE
KETONES UR: NEGATIVE
LEUKOCYTES UA: NEGATIVE
Nitrite: NEGATIVE
RBC / HPF: NONE SEEN (ref 0–?)
SPECIFIC GRAVITY, URINE: 1.015 (ref 1.000–1.030)
Total Protein, Urine: NEGATIVE
Urine Glucose: NEGATIVE
Urobilinogen, UA: 0.2 (ref 0.0–1.0)
pH: 6.5 (ref 5.0–8.0)

## 2017-10-03 LAB — HEPATIC FUNCTION PANEL
ALBUMIN: 4.3 g/dL (ref 3.5–5.2)
ALK PHOS: 80 U/L (ref 39–117)
ALT: 47 U/L — ABNORMAL HIGH (ref 0–35)
AST: 33 U/L (ref 0–37)
Bilirubin, Direct: 0.1 mg/dL (ref 0.0–0.3)
TOTAL PROTEIN: 7.2 g/dL (ref 6.0–8.3)
Total Bilirubin: 0.6 mg/dL (ref 0.2–1.2)

## 2017-10-03 LAB — BASIC METABOLIC PANEL
BUN: 14 mg/dL (ref 6–23)
CALCIUM: 9.4 mg/dL (ref 8.4–10.5)
CO2: 26 mEq/L (ref 19–32)
Chloride: 104 mEq/L (ref 96–112)
Creatinine, Ser: 0.78 mg/dL (ref 0.40–1.20)
GFR: 87.22 mL/min (ref >60.00)
Glucose, Bld: 95 mg/dL (ref 70–99)
Potassium: 4 mEq/L (ref 3.5–5.1)
SODIUM: 138 meq/L (ref 135–145)

## 2017-10-03 LAB — CBC WITH DIFFERENTIAL/PLATELET
BASOS ABS: 0.1 10*3/uL (ref 0.0–0.1)
Basophils Relative: 1.2 % (ref 0.0–3.0)
Eosinophils Absolute: 0.1 10*3/uL (ref 0.0–0.7)
Eosinophils Relative: 2 % (ref 0.0–5.0)
HEMATOCRIT: 39.3 % (ref 36.0–46.0)
Hemoglobin: 12.1 g/dL (ref 12.0–15.0)
Lymphocytes Relative: 27.9 % (ref 12.0–46.0)
Lymphs Abs: 1.6 10*3/uL (ref 0.7–4.0)
MCHC: 30.7 g/dL (ref 30.0–36.0)
MONOS PCT: 4.7 % (ref 3.0–12.0)
Monocytes Absolute: 0.3 10*3/uL (ref 0.1–1.0)
NEUTROS PCT: 64.2 % (ref 43.0–77.0)
Neutro Abs: 3.7 10*3/uL (ref 1.4–7.7)
Platelets: 326 10*3/uL (ref 150.0–400.0)
RBC: 6.09 Mil/uL — AB (ref 3.87–5.11)
RDW: 15 % (ref 11.5–15.5)
WBC: 5.8 10*3/uL (ref 4.0–10.5)

## 2017-10-03 LAB — LIPID PANEL
CHOL/HDL RATIO: 3
Cholesterol: 179 mg/dL (ref 0–200)
HDL: 60.5 mg/dL (ref >39.00)
LDL Cholesterol: 98 mg/dL (ref 0–99)
NONHDL: 118.81
Triglycerides: 105 mg/dL (ref 0.0–149.0)
VLDL: 21 mg/dL (ref 0.0–40.0)

## 2017-10-03 LAB — TSH: TSH: 1.77 u[IU]/mL (ref 0.35–4.50)

## 2017-10-05 LAB — VITAMIN D 1,25 DIHYDROXY
VITAMIN D3 1, 25 (OH): 53 pg/mL
Vitamin D 1, 25 (OH)2 Total: 53 pg/mL (ref 18–72)
Vitamin D2 1, 25 (OH)2: <8 pg/mL

## 2017-11-28 ENCOUNTER — Telehealth: Payer: 59 | Admitting: Nurse Practitioner

## 2017-11-28 DIAGNOSIS — N3000 Acute cystitis without hematuria: Secondary | ICD-10-CM | POA: Diagnosis not present

## 2017-11-28 MED ORDER — NITROFURANTOIN MONOHYD MACRO 100 MG PO CAPS
100.0000 mg | ORAL_CAPSULE | Freq: Two times a day (BID) | ORAL | 0 refills | Status: DC
Start: 2017-11-28 — End: 2018-03-12

## 2017-11-28 NOTE — Progress Notes (Signed)

## 2017-12-10 ENCOUNTER — Telehealth: Payer: 59 | Admitting: Family

## 2017-12-10 ENCOUNTER — Other Ambulatory Visit: Payer: Self-pay | Admitting: Family

## 2017-12-10 DIAGNOSIS — R399 Unspecified symptoms and signs involving the genitourinary system: Secondary | ICD-10-CM

## 2017-12-10 MED ORDER — CIPROFLOXACIN HCL 500 MG PO TABS: 500.0000 mg | ORAL_TABLET | Freq: Two times a day (BID) | ORAL | 0 refills | Status: DC

## 2017-12-10 NOTE — Addendum Note (Signed)
Addended by: Jannifer Rodney A on: 12/10/2017 04:57 PM   Modules accepted: Orders

## 2017-12-10 NOTE — Progress Notes (Signed)
We are sorry that you are not feeling well.  Here is how we plan to help!  Based on what you shared with me it looks like you most likely have a simple urinary tract infection.  A UTI (Urinary Tract Infection) is a bacterial infection of the bladder.  Most cases of urinary tract infections are simple to treat but a key part of your care is to encourage you to drink plenty of fluids and watch your symptoms carefully.  I have prescribed Ciprofloxacin 500 mg twice a day for 5 days.  Your symptoms should gradually improve. Call us if the burning in your urine worsens, you develop worsening fever, back pain or pelvic pain or if your symptoms do not resolve after completing the antibiotic.  If your symptoms do not resolve please follow up with your PCP.  Urinary tract infections can be prevented by drinking plenty of water to keep your body hydrated.  Also be sure when you wipe, wipe from front to back and don't hold it in!  If possible, empty your bladder every 4 hours.  Your e-visit answers were reviewed by a board certified advanced clinical practitioner to complete your personal care plan.  Depending on the condition, your plan could have included both over the counter or prescription medications.  If there is a problem please reply  once you have received a response from your provider.  Your safety is important to [place].  If you have drug allergies check your prescription carefully.    You can use MyChart to ask questions about today's visit, request a non-urgent call back, or ask for a work or school excuse for 24 hours related to this e-Visit. If it has been greater than 24 hours you will need to follow up with your provider, or enter a new e-Visit to address those concerns.   You will get an e-mail in the next two days asking about your experience.  I hope that your e-visit has been valuable and will speed your recovery. Thank you for using e-visits.

## 2017-12-10 NOTE — Addendum Note (Signed)
Addended by: Jannifer Rodney A on: 12/10/2017 10:27 AM   Modules accepted: Orders

## 2018-02-23 ENCOUNTER — Telehealth: Payer: 59 | Admitting: Family Medicine

## 2018-02-23 ENCOUNTER — Encounter: Payer: Self-pay | Admitting: Family Medicine

## 2018-02-23 DIAGNOSIS — N390 Urinary tract infection, site not specified: Secondary | ICD-10-CM

## 2018-02-23 NOTE — Progress Notes (Signed)
Based on what you shared with me it looks like you have a serious condition that should be evaluated in a face to face office visit. Due to your recent infection in the last 90 days and the need to treat for an extended period of time with a broad specturm antibiotic to resolve symptoms it is best that you seek face to face care where you can have your urine tested and potentially a urine culture.  Unfortunately through this service of evisits we are unable to update insurance. Apologies we could not treat you for this condition via evisits.  NOTE: If you entered your credit card information for this eVisit, you will not be charged. You may see a "hold" on your card for the $30 but that hold will drop off and you will not have a charge processed.  If you are having a true medical emergency please call 911.  If you need an urgent face to face visit, [facility] has four urgent care centers for your convenience.  If you need care fast and have a high deductible or no insurance consider:   [email or web address] to reserve your spot online an avoid wait times  [facility] [address] [place], [place] [address] 8 am to 8 pm Monday-Friday 10 am to 4 pm Saturday-Sunday *Across the street from [employer]  [address] [place], [address] 8 am to 5 pm Monday-Friday * In the [facility] on the [facility]   The following sites will take your  insurance:  . [facility]  [phone] Get Driving Directions Find a Provider at this Location  [address] [place], [place] [address] . 10 am to 8 pm Monday-Friday . 12 pm to 8 pm Saturday-Sunday   . [facility] at [facility]  [phone] Get Driving Directions Find a Provider at this Location  [address] [place], [place] [address] . 8 am to 8 pm Monday-Friday . 9 am to 6 pm Saturday . 11 am to 6 pm  Sunday   . [facility] at [facility]  [phone] Get Driving Directions  [address] [place], [place] [address] . 8 am to 8 pm Monday-Friday . 8 am to 4 pm Saturday-Sunday   Your e-visit answers were reviewed by a board certified advanced clinical practitioner to complete your personal care plan.  Thank you for using e-Visits.

## 2018-03-12 ENCOUNTER — Encounter: Payer: Self-pay | Admitting: Family

## 2018-03-12 ENCOUNTER — Ambulatory Visit (INDEPENDENT_AMBULATORY_CARE_PROVIDER_SITE_OTHER): Payer: No Typology Code available for payment source | Admitting: Family

## 2018-03-12 VITALS — BP 142/91 | HR 62 | Temp 98.5°F | Resp 16 | Ht 58.5 in | Wt 153.0 lb

## 2018-03-12 DIAGNOSIS — Z0000 Encounter for general adult medical examination without abnormal findings: Secondary | ICD-10-CM

## 2018-03-12 DIAGNOSIS — M5432 Sciatica, left side: Secondary | ICD-10-CM | POA: Diagnosis not present

## 2018-03-12 DIAGNOSIS — I10 Essential (primary) hypertension: Secondary | ICD-10-CM | POA: Diagnosis not present

## 2018-03-12 DIAGNOSIS — M5431 Sciatica, right side: Secondary | ICD-10-CM | POA: Diagnosis not present

## 2018-03-12 MED ORDER — MELOXICAM 7.5 MG PO TABS: 7.5000 mg | ORAL_TABLET | Freq: Every evening | ORAL | 0 refills | Status: DC | PRN

## 2018-03-12 MED ORDER — CYCLOBENZAPRINE HCL 5 MG PO TABS: 5.0000 mg | ORAL_TABLET | Freq: Three times a day (TID) | ORAL | 1 refills | Status: DC | PRN

## 2018-03-12 NOTE — Progress Notes (Signed)
Subjective:    Patient ID: Bridget Rodriguez, female    DOB: 08-Feb-1980, 38 y.o.   MRN: [id number]  HPI  Patient presents today with chief complaint of low back pain.  Reports that back pain started about 1 week ago after she did some heavy lifting at work. Reports that it hurts to sit, lay comfortably. Using tylenol with some improving.  She has bilateral pain radiating into the buttocks R>L.  Reports one day pain came up to her right mid back.       HTN- maintained on amlodipine and coreg.  BP Readings from Last 3 Encounters:  03/12/18 (!) 142/91  10/02/17 121/82  05/25/17 119/79   Reports that she forgot her AM amlodipine today.   Reports that back pain started about 1 week ago after she did some heavy lifting at work. Reports that it hurts to sit, lay comfortably. Using tylenol with some improving.  She has bilateral pain radiating into the buttocks R>L.  Reports one day pain came up to her right mid back.     Review of Systems See HPI  Past Medical History:  Diagnosis Date  . Fatty liver   . Hypertension   . Pelvic mass 2017   Benign pelvic floor mass per pt.     Social History   Socioeconomic History  . Marital status: Married    Spouse name: Not on file  . Number of children: Not on file  . Years of education: Not on file  . Highest education level: Not on file  Occupational History  . Not on file  Social Needs  . Financial resource strain: Not on file  . Food insecurity:    Worry: Not on file    Inability: Not on file  . Transportation needs:    Medical: Not on file    Non-medical: Not on file  Tobacco Use  . Smoking status: Never Smoker  . Smokeless tobacco: Never Used  Substance and Sexual Activity  . Alcohol use: No  . Drug use: No  . Sexual activity: Yes    Birth control/protection: Surgical  Lifestyle  . Physical activity:    Days per week: Not on file    Minutes per session: Not on file  . Stress: Not on file  Relationships  . Social  connections:    Talks on phone: Not on file    Gets together: Not on file    Attends religious service: Not on file    Active member of club or organization: Not on file    Attends meetings of clubs or organizations: Not on file    Relationship status: Not on file  . Intimate partner violence:    Fear of current or ex partner: Not on file    Emotionally abused: Not on file    Physically abused: Not on file    Forced sexual activity: Not on file  Other Topics Concern  . 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     Past Surgical History:  Procedure Laterality Date  . CESAREAN SECTION  2009  . CESAREAN SECTION WITH BILATERAL TUBAL LIGATION Bilateral 2017    Family History  Problem Relation Age of Onset  . Hypertension Father     No  Known Allergies  Current Outpatient Medications on File Prior to Visit  Medication Sig Dispense Refill  . amLODipine (NORVASC) 5 MG tablet Take 1 tablet (5 mg total) by mouth daily. 90 tablet 1  . betamethasone dipropionate (DIPROLENE) 0.05 % cream Apply topically 2 (two) times daily. 30 g 0  . carvedilol (COREG) 12.5 MG tablet Take 1 tablet (12.5 mg total) by mouth 2 (two) times daily. 180 tablet 1   No current facility-administered medications on file prior to visit.     BP (!) 142/91 (BP Location: Right Arm, Patient Position: Sitting, Cuff Size: Small)   Pulse 62   Temp 98.5 F (36.9 C) (Oral)   Resp 16   Ht 4' 10.5" (1.486 m)   Wt 153 lb (69.4 kg)   SpO2 100%   BMI 31.43 kg/m       Objective:   Physical Exam Constitutional:      Appearance: She is well-developed.  Neck:     Musculoskeletal: Neck supple.     Thyroid: No thyromegaly.  Cardiovascular:     Rate and Rhythm: Normal rate and regular rhythm.     Heart sounds: Normal heart sounds. No  murmur.  Pulmonary:     Effort: Pulmonary effort is normal. No respiratory distress.     Breath sounds: Normal breath sounds. No wheezing.  Skin:    General: Skin is warm and dry.  Neurological:     Mental Status: She is alert and oriented to person, place, and time.     Deep Tendon Reflexes: Babinski sign absent on the right side.     Reflex Scores:      Patellar reflexes are 2+ on the right side and 2+ on the left side.    Comments: Bilateral LE strength is 5/5   Psychiatric:        Behavior: Behavior normal.        Thought Content: Thought content normal.        Judgment: Judgment normal.           Assessment & Plan:  HTN- bp is elevated today. Reminded her not to skip doses. She returns in a few weeks for cpx and we can recheck at that time.  Sciatica- new. Advised pt as follows:  Please begin meloxicam once daily. You may use flexeril at bedtime as needed. Please call if symptoms worsen or if symptoms do not improve.

## 2018-03-12 NOTE — Patient Instructions (Signed)
Please begin meloxicam once daily. You may use flexeril at bedtime as needed. Please call if symptoms worsen or if symptoms do not improve.    Sciatica  Sciatica is pain, numbness, weakness, or tingling along the path of the sciatic nerve. The sciatic nerve starts in the lower back and runs down the back of each leg. The nerve controls the muscles in the lower leg and in the back of the knee. It also provides feeling (sensation) to the back of the thigh, the lower leg, and the sole of the foot. Sciatica is a symptom of another medical condition that pinches or puts pressure on the sciatic nerve. Generally, sciatica only affects one side of the body. Sciatica usually goes away on its own or with treatment. In some cases, sciatica may keep coming back (recur). What are the causes? This condition is caused by pressure on the sciatic nerve, or pinching of the sciatic nerve. This may be the result of:  A disk in between the bones of the spine (vertebrae) bulging out too far (herniated disk).  Age-related changes in the spinal disks (degenerative disk disease).  A pain disorder that affects a muscle in the buttock (piriformis syndrome).  Extra bone growth (bone spur) near the sciatic nerve.  An injury or break (fracture) of the pelvis.  Pregnancy.  Tumor (rare). What increases the risk? The following factors may make you more likely to develop this condition:  Playing sports that place pressure or stress on the spine, such as football or weight lifting.  Having poor strength and flexibility.  A history of back injury.  A history of back surgery.  Sitting for long periods of time.  Doing activities that involve repetitive bending or lifting.  Obesity. What are the signs or symptoms? Symptoms can vary from mild to very severe, and they may include:  Any of these problems in the lower back, leg, hip, or buttock: ? Mild tingling or dull aches. ? Burning sensations. ? Sharp  pains.  Numbness in the back of the calf or the sole of the foot.  Leg weakness.  Severe back pain that makes movement difficult. These symptoms may get worse when you cough, sneeze, or laugh, or when you sit or stand for long periods of time. Being overweight may also make symptoms worse. In some cases, symptoms may recur over time. How is this diagnosed? This condition may be diagnosed based on:  Your symptoms.  A physical exam. Your health care provider may ask you to do certain movements to check whether those movements trigger your symptoms.  You may have tests, including: ? Blood tests. ? X-rays. ? MRI. ? CT scan. How is this treated? In many cases, this condition improves on its own, without any treatment. However, treatment may include:  Reducing or modifying physical activity during periods of pain.  Exercising and stretching to strengthen your abdomen and improve the flexibility of your spine.  Icing and applying heat to the affected area.  Medicines that help: ? To relieve pain and swelling. ? To relax your muscles.  Injections of medicines that help to relieve pain, irritation, and inflammation around the sciatic nerve (steroids).  Surgery. Follow these instructions at home: Medicines  Take over-the-counter and prescription medicines only as told by your health care provider.  Do not drive or operate heavy machinery while taking prescription pain medicine. Managing pain  If directed, apply ice to the affected area. ? Put ice in a plastic bag. ? Place a  towel between your skin and the bag. ? Leave the ice on for 20 minutes, 2-3 times a day.  After icing, apply heat to the affected area before you exercise or as often as told by your health care provider. Use the heat source that your health care provider recommends, such as a moist heat pack or a heating pad. ? Place a towel between your skin and the heat source. ? Leave the heat on for 20-30  minutes. ? Remove the heat if your skin turns bright red. This is especially important if you are unable to feel pain, heat, or cold. You may have a greater risk of getting burned. Activity  Return to your normal activities as told by your health care provider. Ask your health care provider what activities are safe for you. ? Avoid activities that make your symptoms worse.  Take brief periods of rest throughout the day. Resting in a lying or standing position is usually better than sitting to rest. ? When you rest for longer periods, mix in some mild activity or stretching between periods of rest. This will help to prevent stiffness and pain. ? Avoid sitting for long periods of time without moving. Get up and move around at least one time each hour.  Exercise and stretch regularly, as told by your health care provider.  Do not lift anything that is heavier than 10 lb (4.5 kg) while you have symptoms of sciatica. When you do not have symptoms, you should still avoid heavy lifting, especially repetitive heavy lifting.  When you lift objects, always use proper lifting technique, which includes: ? Bending your knees. ? Keeping the load close to your body. ? Avoiding twisting. General instructions  Use good posture. ? Avoid leaning forward while sitting. ? Avoid hunching over while standing.  Maintain a healthy weight. Excess weight puts extra stress on your back and makes it difficult to maintain good posture.  Wear supportive, comfortable shoes. Avoid wearing high heels.  Avoid sleeping on a mattress that is too soft or too hard. A mattress that is firm enough to support your back when you sleep may help to reduce your pain.  Keep all follow-up visits as told by your health care provider. This is important. Contact a health care provider if:  You have pain that wakes you up when you are sleeping.  You have pain that gets worse when you lie down.  Your pain is worse than you have  experienced in the past.  Your pain lasts longer than 4 weeks.  You experience unexplained weight loss. Get help right away if:  You lose control of your bowel or bladder (incontinence).  You have: ? Weakness in your lower back, pelvis, buttocks, or legs that gets worse. ? Redness or swelling of your back. ? A burning sensation when you urinate. This information is not intended to replace advice given to you by your health care provider. Make sure you discuss any questions you have with your health care provider. Document Released: 01/18/2001 Document Revised: 06/30/2015 Document Reviewed: 10/03/2014 Elsevier Interactive Patient Education  2019 [employer].

## 2018-03-26 ENCOUNTER — Encounter: Payer: 59 | Admitting: Family

## 2018-04-02 ENCOUNTER — Encounter: Payer: No Typology Code available for payment source | Admitting: Family

## 2018-04-14 ENCOUNTER — Other Ambulatory Visit: Payer: Self-pay | Admitting: Family

## 2018-05-04 ENCOUNTER — Telehealth: Payer: No Typology Code available for payment source | Admitting: Nurse Practitioner

## 2018-05-04 DIAGNOSIS — J029 Acute pharyngitis, unspecified: Secondary | ICD-10-CM | POA: Diagnosis not present

## 2018-05-04 MED ORDER — AMOXICILLIN-POT CLAVULANATE 875-125 MG PO TABS: 1.0000 | ORAL_TABLET | Freq: Two times a day (BID) | ORAL | 0 refills | Status: DC

## 2018-05-04 NOTE — Progress Notes (Signed)
We are sorry that you are not feeling well.  Here is how we plan to help!  Based on what you have shared with me it is likely that you possibly have strep pharyngitis.  Strep pharyngitis is inflammation and infection in the back of the throat.  This is an infection cause by bacteria and is treated with antibiotics.  I have prescribed Augmentin 875 mg/ 125 mg one tablet twice daily for 7 days. For throat pain, we recommend over the counter oral pain relief medications such as acetaminophen or aspirin, or anti-inflammatory medications such as ibuprofen or naproxen sodium. Topical treatments such as oral throat lozenges or sprays may be used as needed. Strep infections are not as easily transmitted as other respiratory infections, however we still recommend that you avoid close contact with loved ones, especially the very young and elderly.  Remember to wash your hands thoroughly throughout the day as this is the number one way to prevent the spread of infection and wipe down door knobs and counters with disinfectant.   Home Care:  Only take medications as instructed by your medical team.  Complete the entire course of an antibiotic.  Do not take these medications with alcohol.  A steam or ultrasonic humidifier can help congestion.  You can place a towel over your head and breathe in the steam from hot water coming from a faucet.  Avoid close contacts especially the very young and the elderly.  Cover your mouth when you cough or sneeze.  Always remember to wash your hands.  Get Help Right Away If:  You develop worsening fever or sinus pain.  You develop a severe head ache or visual changes.  Your symptoms persist after you have completed your treatment plan.  Make sure you  Understand these instructions.  Will watch your condition.  Will get help right away if you are not doing well or get worse.  Your e-visit answers were reviewed by a board certified advanced clinical practitioner  to complete your personal care plan.  Depending on the condition, your plan could have included both over the counter or prescription medications.  If there is a problem please reply  once you have received a response from your provider.  Your safety is important to [place].  If you have drug allergies check your prescription carefully.    You can use MyChart to ask questions about today's visit, request a non-urgent call back, or ask for a work or school excuse for 24 hours related to this e-Visit. If it has been greater than 24 hours you will need to follow up with your provider, or enter a new e-Visit to address those concerns.  You will get an e-mail in the next two days asking about your experience.  I hope that your e-visit has been valuable and will speed your recovery. Thank you for using e-visits.  5 minutes spent reviewing and documenting in chart.

## 2018-05-12 ENCOUNTER — Encounter: Payer: Self-pay | Admitting: Family

## 2018-05-12 DIAGNOSIS — I10 Essential (primary) hypertension: Secondary | ICD-10-CM

## 2018-05-12 DIAGNOSIS — I517 Cardiomegaly: Secondary | ICD-10-CM

## 2018-05-14 ENCOUNTER — Encounter: Payer: Self-pay | Admitting: Family

## 2018-05-14 NOTE — Addendum Note (Signed)
Addended by: Sandford Craze on: 05/14/2018 03:22 PM   Modules accepted: Orders

## 2018-06-11 ENCOUNTER — Other Ambulatory Visit: Payer: Self-pay | Admitting: Family

## 2018-07-03 ENCOUNTER — Other Ambulatory Visit: Payer: Self-pay | Admitting: Family

## 2018-07-06 ENCOUNTER — Encounter: Payer: Self-pay | Admitting: Family

## 2018-07-09 NOTE — Telephone Encounter (Signed)
For printed for physician

## 2018-07-11 ENCOUNTER — Encounter: Payer: Self-pay | Admitting: Family Medicine

## 2018-07-11 ENCOUNTER — Other Ambulatory Visit: Payer: Self-pay

## 2018-07-11 ENCOUNTER — Ambulatory Visit (INDEPENDENT_AMBULATORY_CARE_PROVIDER_SITE_OTHER): Payer: No Typology Code available for payment source | Admitting: Family Medicine

## 2018-07-11 VITALS — BP 132/80 | HR 87 | Temp 98.1°F | Resp 16 | Ht <= 58 in | Wt 153.0 lb

## 2018-07-11 DIAGNOSIS — R002 Palpitations: Secondary | ICD-10-CM | POA: Diagnosis not present

## 2018-07-11 NOTE — Progress Notes (Signed)
Chief Complaint  Patient presents with  . Palpitations    feels pluse in throat, heart skips, one week, thought it was going to go away, off and on    Bridget Rodriguez is a 38 y.o. female here for palpitations.  Length of issue: 1 week How long does it last: several seconds Light headedness/passing out? No Chest pain/shortness of breath? No Hx of arrhythmia? No Medication changes/illicit substances? No She does snore, has gained wt recently.  Cycles are irreg, not particularly heavy.   ROS: Cardiac: As noted in HPI Pulm: No SOB  Past Medical History:  Diagnosis Date  . Fatty liver   . Hypertension   . Pelvic mass 2017   Benign pelvic floor mass per pt.   Past Surgical History:  Procedure Laterality Date  . CESAREAN SECTION  2009  . CESAREAN SECTION WITH BILATERAL TUBAL LIGATION Bilateral 2017   No Known Allergies Allergies as of 07/11/2018   No Known Allergies     Medication List       Accurate as of July 11, 2018  4:18 PM. If you have any questions, ask your nurse or doctor.        STOP taking these medications   amoxicillin-clavulanate 875-125 MG tablet Commonly known as:  AUGMENTIN Stopped by:  Sharlene Dory, DO   cyclobenzaprine 5 MG tablet Commonly known as:  FLEXERIL Stopped by:  Sharlene Dory, DO   meloxicam 7.5 MG tablet Commonly known as:  MOBIC Stopped by:  Sharlene Dory, DO     TAKE these medications   amLODipine 5 MG tablet Commonly known as:  NORVASC Take 1 tablet by mouth once daily   betamethasone dipropionate 0.05 % cream Commonly known as:  DIPROLENE Apply topically 2 (two) times daily.   carvedilol 12.5 MG tablet Commonly known as:  COREG Take 1 tablet by mouth twice daily       BP 132/80 (BP Location: Left Arm, Patient Position: Sitting, Cuff Size: Normal)   Pulse 87   Temp 98.1 F (36.7 C) (Oral)   Resp 16   Ht 4\' 10"  (1.473 m)   Wt 153 lb (69.4 kg)   SpO2 98%   BMI 31.98 kg/m  Gen: Awake,  alert, appearing stated age Eyes: PERRLA Mouth: MMM Heart: RRR, no bruits, no LE edema Lungs: CTAB, no accessory muscle use Neuro: No cerebellar signs MSK: No muscle group atrophy or asymmetry Psych: Age appropriate judgment and insight, mood/affect WNL  Palpitations - Plan: EKG 12-Lead, CBC, Comprehensive metabolic panel, TSH, T4, free, Magnesium  EKG nml. Ck labs. Not drinking caffeine/alcohol. Stay hydrated. If it happens again, she will let us know and I will order a Holter.  F/u prn w me for this.  The patient voiced understanding and agreement to the plan.  Jilda Roche Denzil Bristol 4:18 PM 07/11/18

## 2018-07-11 NOTE — Patient Instructions (Signed)
I am somewhat worried about sleep apnea. If you are waking up gasping for air, having lots of daytime fatigue, or worsening snoring, let me know.   If you have episodes again, let me know and we will set up a Holter monitor where we monitor your heart rhythm for longer periods of time.  Give [place] 2-3 business days to get the results of your labs back.   Let us know if you need anything.

## 2018-07-13 ENCOUNTER — Other Ambulatory Visit (INDEPENDENT_AMBULATORY_CARE_PROVIDER_SITE_OTHER): Payer: No Typology Code available for payment source

## 2018-07-13 ENCOUNTER — Other Ambulatory Visit: Payer: Self-pay

## 2018-07-13 DIAGNOSIS — R002 Palpitations: Secondary | ICD-10-CM

## 2018-07-13 NOTE — Addendum Note (Signed)
Addended by: Harley Alto on: 07/13/2018 02:21 PM   Modules accepted: Orders

## 2018-07-14 LAB — CBC
HCT: 42 % (ref 35.0–45.0)
Hemoglobin: 12.6 g/dL (ref 11.7–15.5)
MCH: 20.2 pg — ABNORMAL LOW (ref 27.0–33.0)
MCHC: 30 g/dL — ABNORMAL LOW (ref 32.0–36.0)
MCV: 67.2 fL — ABNORMAL LOW (ref 80.0–100.0)
MPV: 11.6 fL (ref 7.5–12.5)
Platelets: 408 10*3/uL — ABNORMAL HIGH (ref 140–400)
RBC: 6.25 10*6/uL — ABNORMAL HIGH (ref 3.80–5.10)
RDW: 15.4 % — ABNORMAL HIGH (ref 11.0–15.0)
WBC: 8.3 10*3/uL (ref 3.8–10.8)

## 2018-07-14 LAB — COMPREHENSIVE METABOLIC PANEL
AG Ratio: 1.6 (calc) (ref 1.0–2.5)
ALT: 37 U/L — ABNORMAL HIGH (ref 6–29)
AST: 22 U/L (ref 10–30)
Albumin: 4.4 g/dL (ref 3.6–5.1)
Alkaline phosphatase (APISO): 86 U/L (ref 31–125)
BUN: 17 mg/dL (ref 7–25)
CO2: 24 mmol/L (ref 20–32)
Calcium: 9.6 mg/dL (ref 8.6–10.2)
Chloride: 105 mmol/L (ref 98–110)
Creat: 0.8 mg/dL (ref 0.50–1.10)
Globulin: 2.8 g/dL (calc) (ref 1.9–3.7)
Glucose, Bld: 94 mg/dL (ref 65–99)
Potassium: 4 mmol/L (ref 3.5–5.3)
Sodium: 139 mmol/L (ref 135–146)
Total Bilirubin: 0.4 mg/dL (ref 0.2–1.2)
Total Protein: 7.2 g/dL (ref 6.1–8.1)

## 2018-07-14 LAB — TSH: TSH: 2.88 mIU/L

## 2018-07-14 LAB — MAGNESIUM: Magnesium: 2.2 mg/dL (ref 1.5–2.5)

## 2018-07-14 LAB — T4, FREE: Free T4: 1.4 ng/dL (ref 0.8–1.8)

## 2018-07-16 ENCOUNTER — Encounter: Payer: Self-pay | Admitting: Family Medicine

## 2018-07-17 ENCOUNTER — Other Ambulatory Visit: Payer: Self-pay | Admitting: Family Medicine

## 2018-07-17 DIAGNOSIS — R002 Palpitations: Secondary | ICD-10-CM

## 2018-07-17 NOTE — Progress Notes (Signed)
.  ho

## 2018-07-21 ENCOUNTER — Telehealth: Payer: No Typology Code available for payment source | Admitting: Physician Assistant

## 2018-07-21 DIAGNOSIS — J020 Streptococcal pharyngitis: Secondary | ICD-10-CM

## 2018-07-21 MED ORDER — AMOXICILLIN 875 MG PO TABS: 875.0000 mg | ORAL_TABLET | Freq: Two times a day (BID) | ORAL | 0 refills | Status: DC

## 2018-07-21 NOTE — Progress Notes (Signed)
I have spent 5 minutes in review of e-visit questionnaire, review and updating patient chart, medical decision making and response to patient.   Maythe Deramo Cody Iliza Blankenbeckler, PA-C    

## 2018-07-21 NOTE — Progress Notes (Signed)
We are sorry that you are not feeling well.  Here is how we plan to help!  Based on what you have shared with me it is likely that you have strep pharyngitis.  Strep pharyngitis is inflammation and infection in the back of the throat.  This is an infection cause by bacteria and is treated with antibiotics.  I have prescribed Augmentin 875 mg one tablet twice daily for 10 days. For throat pain, we recommend over the counter oral pain relief medications such as acetaminophen or aspirin, or anti-inflammatory medications such as ibuprofen or naproxen sodium. Topical treatments such as oral throat lozenges or sprays may be used as needed. Strep infections are not as easily transmitted as other respiratory infections, however we still recommend that you avoid close contact with loved ones, especially the very young and elderly.  Remember to wash your hands thoroughly throughout the day as this is the number one way to prevent the spread of infection and wipe down door knobs and counters with disinfectant.   Home Care:  Only take medications as instructed by your medical team.  Complete the entire course of an antibiotic.  Do not take these medications with alcohol.  A steam or ultrasonic humidifier can help congestion.  You can place a towel over your head and breathe in the steam from hot water coming from a faucet.  Avoid close contacts especially the very young and the elderly.  Cover your mouth when you cough or sneeze.  Always remember to wash your hands.  Get Help Right Away If:  You develop worsening fever or sinus pain.  You develop a severe head ache or visual changes.  Your symptoms persist after you have completed your treatment plan.  Make sure you  Understand these instructions.  Will watch your condition.  Will get help right away if you are not doing well or get worse.  Your e-visit answers were reviewed by a board certified advanced clinical practitioner to complete  your personal care plan.  Depending on the condition, your plan could have included both over the counter or prescription medications.  If there is a problem please reply  once you have received a response from your provider.  Your safety is important to [place].  If you have drug allergies check your prescription carefully.    You can use MyChart to ask questions about today's visit, request a non-urgent call back, or ask for a work or school excuse for 24 hours related to this e-Visit. If it has been greater than 24 hours you will need to follow up with your provider, or enter a new e-Visit to address those concerns.  You will get an e-mail in the next two days asking about your experience.  I hope that your e-visit has been valuable and will speed your recovery. Thank you for using e-visits.

## 2018-08-07 ENCOUNTER — Encounter: Payer: Self-pay | Admitting: Family Medicine

## 2018-08-07 DIAGNOSIS — R002 Palpitations: Secondary | ICD-10-CM

## 2018-08-08 ENCOUNTER — Telehealth: Payer: No Typology Code available for payment source | Admitting: Nurse Practitioner

## 2018-08-08 DIAGNOSIS — J029 Acute pharyngitis, unspecified: Secondary | ICD-10-CM

## 2018-08-08 NOTE — Telephone Encounter (Signed)
Gwen can you follow up on this heart monitor for the patient it was placed on 07/17/18 by Dr.  Nani Ravens

## 2018-08-08 NOTE — Progress Notes (Signed)
Based on what you shared with me it looks like you have a recurrent issue ,that should be evaluated in a face to face office visit. Need to have strep test done since this is reoccurence   NOTE: If you entered your credit card information for this eVisit, you will not be charged. You may see a "hold" on your card for the $30 but that hold will drop off and you will not have a charge processed.  If you are having a true medical emergency please call 911.  If you need an urgent face to face visit, [place] has four urgent care centers for your convenience.  If you need care fast and have a high deductible or no insurance consider:   [email or web address] to reserve your spot online an avoid wait times  [facility] [address], [address] [place], [place] [address] 8 am to 8 pm Monday-Friday 10 am to 4 pm Saturday-Sunday *Across the street from [employer]  [place], [address] 8 am to 5 pm Monday-Friday * In the [facility] on the [facility]   The following sites will take your  insurance:  . [facility] [place] a Provider at this Location  [address] [place], [place] [address] . 10 am to 8 pm Monday-Friday . 12 pm to 8 pm Saturday-Sunday   . [facility] at [place] a Provider at this Location  [place], [place], [place] [address] . 8 am to 8 pm Monday-Friday . 9 am to 6 pm Saturday . 11 am to 6 pm Sunday   . [facility] at [place] Get Driving Directions  [address] [place], [place] [address] . 8 am to 8 pm Monday-Friday . 8 am to 4 pm Saturday-Sunday   Your e-visit answers were reviewed by a board certified advanced clinical practitioner to complete your personal care plan.

## 2018-08-13 ENCOUNTER — Encounter: Payer: Self-pay | Admitting: Physician Assistant

## 2018-08-13 ENCOUNTER — Telehealth: Payer: No Typology Code available for payment source | Admitting: Physician Assistant

## 2018-08-13 DIAGNOSIS — J029 Acute pharyngitis, unspecified: Secondary | ICD-10-CM

## 2018-08-13 NOTE — Progress Notes (Signed)
Based on what you shared with me, I feel your condition warrants further evaluation and I recommend that you be seen for a face to face office visit.  Bridget Rodriguez, Bridget Rodriguez may follow up with the last doctor you have seen or with your primary care doctor for further evaluation.   NOTE: If you entered your credit card information for this eVisit, you will not be charged. You may see a "hold" on your card for the $35 but that hold will drop off and you will not have a charge processed.  If you are having a true medical emergency please call 911.     For an urgent face to face visit, [place] has five urgent care centers for your convenience:    [email or web address] to reserve your spot online an avoid wait times  [facility] [address] , [place] [address] Modified hours of operation: Monday-Friday, 12 PM to 6 PM  Closed Saturday & Sunday  *Across the street from [place] (New Address!) [address], [place], [place] [address] *Just off [address], across the road from [place] hours of operation: Monday-Friday, 12 PM to 6 PM  Closed Saturday & Sunday   The following sites will take your insurance:  . [facility]    [phone]                  Get Driving Directions  [address], [place] [address] . 10 am to 8 pm Monday-Friday . 12 pm to 8 pm Saturday-Sunday   . [facility] at [place]                  Get Driving Directions  [address], [place], [place] [address] . 8 am to 8 pm Monday-Friday . 9 am to 6 pm Saturday . 11 am to 6 pm Sunday   . [facility] at [place]                  Get Driving Directions   [address] [place], [place] [address] . 8 am to 8 pm Monday-Friday . 8 am to 4 pm Saturday-Sunday    . [facility] at [place]                    Get Driving Directions  [phone]  [address] [place], [place] [address]  . Monday-Friday, 12 PM to 6 PM    Your e-visit answers were reviewed by a board certified advanced clinical practitioner to complete your personal care plan.  Thank you for using e-Visits. I spent 5-10 minutes on review and completion of this note- Lacy Duverney [facility]

## 2018-08-14 ENCOUNTER — Encounter: Payer: Self-pay | Admitting: Family

## 2018-08-15 ENCOUNTER — Other Ambulatory Visit: Payer: Self-pay | Admitting: Family

## 2018-08-15 DIAGNOSIS — R002 Palpitations: Secondary | ICD-10-CM

## 2018-08-15 NOTE — Progress Notes (Signed)
t

## 2018-10-22 ENCOUNTER — Encounter: Payer: No Typology Code available for payment source | Admitting: Family

## 2018-10-22 ENCOUNTER — Ambulatory Visit: Payer: No Typology Code available for payment source | Admitting: Family

## 2018-10-23 ENCOUNTER — Encounter: Payer: No Typology Code available for payment source | Admitting: Family

## 2018-10-29 ENCOUNTER — Ambulatory Visit: Payer: No Typology Code available for payment source | Admitting: Family

## 2018-11-13 ENCOUNTER — Other Ambulatory Visit: Payer: Self-pay | Admitting: Family

## 2018-11-13 ENCOUNTER — Other Ambulatory Visit: Payer: Self-pay

## 2018-11-13 ENCOUNTER — Telehealth: Payer: Self-pay | Admitting: Family

## 2018-11-13 ENCOUNTER — Encounter: Payer: Self-pay | Admitting: Family

## 2018-11-13 ENCOUNTER — Ambulatory Visit (INDEPENDENT_AMBULATORY_CARE_PROVIDER_SITE_OTHER): Payer: No Typology Code available for payment source | Admitting: Family

## 2018-11-13 VITALS — BP 131/95 | HR 69 | Temp 97.5°F | Resp 16 | Ht <= 58 in | Wt 155.0 lb

## 2018-11-13 DIAGNOSIS — Z0001 Encounter for general adult medical examination with abnormal findings: Secondary | ICD-10-CM | POA: Diagnosis not present

## 2018-11-13 DIAGNOSIS — R7989 Other specified abnormal findings of blood chemistry: Secondary | ICD-10-CM

## 2018-11-13 DIAGNOSIS — I10 Essential (primary) hypertension: Secondary | ICD-10-CM

## 2018-11-13 DIAGNOSIS — Z0000 Encounter for general adult medical examination without abnormal findings: Secondary | ICD-10-CM

## 2018-11-13 DIAGNOSIS — R945 Abnormal results of liver function studies: Secondary | ICD-10-CM

## 2018-11-13 LAB — BASIC METABOLIC PANEL
BUN: 14 mg/dL (ref 6–23)
CO2: 27 mEq/L (ref 19–32)
Calcium: 9.5 mg/dL (ref 8.4–10.5)
Chloride: 103 mEq/L (ref 96–112)
Creatinine, Ser: 0.65 mg/dL (ref 0.40–1.20)
GFR: 100.71 mL/min (ref >60.00)
Glucose, Bld: 88 mg/dL (ref 70–99)
Potassium: 3.8 mEq/L (ref 3.5–5.1)
Sodium: 138 mEq/L (ref 135–145)

## 2018-11-13 LAB — CBC WITH DIFFERENTIAL/PLATELET
Basophils Absolute: 0 10*3/uL (ref 0.0–0.1)
Basophils Relative: 0.5 % (ref 0.0–3.0)
Eosinophils Absolute: 0.1 10*3/uL (ref 0.0–0.7)
Eosinophils Relative: 1.6 % (ref 0.0–5.0)
HCT: 41.1 % (ref 36.0–46.0)
Hemoglobin: 12.6 g/dL (ref 12.0–15.0)
Lymphocytes Relative: 33.1 % (ref 12.0–46.0)
Lymphs Abs: 2.4 10*3/uL (ref 0.7–4.0)
MCHC: 30.6 g/dL (ref 30.0–36.0)
MCV: 65.5 fl — ABNORMAL LOW (ref 78.0–100.0)
Monocytes Absolute: 0.4 10*3/uL (ref 0.1–1.0)
Monocytes Relative: 5.6 % (ref 3.0–12.0)
Neutro Abs: 4.3 10*3/uL (ref 1.4–7.7)
Neutrophils Relative %: 59.2 % (ref 43.0–77.0)
Platelets: 323 10*3/uL (ref 150.0–400.0)
RBC: 6.28 Mil/uL — ABNORMAL HIGH (ref 3.87–5.11)
RDW: 15.3 % (ref 11.5–15.5)
WBC: 7.3 10*3/uL (ref 4.0–10.5)

## 2018-11-13 LAB — HEPATIC FUNCTION PANEL
ALT: 61 U/L — ABNORMAL HIGH (ref 0–35)
AST: 30 U/L (ref 0–37)
Albumin: 4.4 g/dL (ref 3.5–5.2)
Alkaline Phosphatase: 94 U/L (ref 39–117)
Bilirubin, Direct: 0.1 mg/dL (ref 0.0–0.3)
Total Bilirubin: 0.7 mg/dL (ref 0.2–1.2)
Total Protein: 7.6 g/dL (ref 6.0–8.3)

## 2018-11-13 LAB — LIPID PANEL
Cholesterol: 187 mg/dL (ref 0–200)
HDL: 60.5 mg/dL (ref >39.00)
LDL Cholesterol: 103 mg/dL — ABNORMAL HIGH (ref 0–99)
NonHDL: 126.45
Total CHOL/HDL Ratio: 3
Triglycerides: 118 mg/dL (ref 0.0–149.0)
VLDL: 23.6 mg/dL (ref 0.0–40.0)

## 2018-11-13 LAB — TSH: TSH: 3.09 u[IU]/mL (ref 0.35–4.50)

## 2018-11-13 NOTE — Telephone Encounter (Signed)
Please let pt know that her liver testing is slightly worse than the last time we checked. Most likely due to her fatty liver and weight gain.  Does she drink alcohol? If so, please discontinue.  Also- I would like for her to complete some additional lab testing to check for other possible causes for her abnormal liver test. I have pended the labs.

## 2018-11-13 NOTE — Telephone Encounter (Signed)
Lvm for patient to call me back at the office about this results.

## 2018-11-13 NOTE — Progress Notes (Signed)
Subjective:    Patient ID: Bridget Rodriguez, female    DOB: Dec 18, 1980, 38 y.o.   MRN: [id number]  HPI  Patient presents today for complete physical.  Immunizations: flu shot up to date, Tdap up to date Diet: see below Exercise: walking, treadmill  Wt Readings from Last 3 Encounters:  11/13/18 155 lb (70.3 kg)  07/11/18 153 lb (69.4 kg)  03/12/18 153 lb (69.4 kg)    Breakfast- wheat bread, egg Lunch- salads (cutting back on rice) Dinner- takeout 2-3 times a week  Pap Smear:  02/08/15 she will complete with GYN Mammogram: due      Review of Systems  Constitutional: Positive for unexpected weight change.  HENT: Negative for hearing loss and rhinorrhea.   Eyes: Negative for visual disturbance.  Respiratory: Negative for cough and shortness of breath.   Cardiovascular: Negative for chest pain.  Gastrointestinal: Negative for constipation and diarrhea.  Genitourinary: Negative for dysuria, frequency and hematuria.  Musculoskeletal: Negative for arthralgias and myalgias.  Skin: Negative for rash.  Neurological: Negative for headaches.  Hematological: Negative for adenopathy.  Psychiatric/Behavioral:       Denies depression/anxiety       Past Medical History:  Diagnosis Date  . Fatty liver   . Hypertension   . Pelvic mass 2017   Benign pelvic floor mass per pt.     Social History   Socioeconomic History  . Marital status: Married    Spouse name: Not on file  . Number of children: Not on file  . Years of education: Not on file  . Highest education level: Not on file  Occupational History  . Not on file  Social Needs  . Financial resource strain: Not on file  . Food insecurity    Worry: Not on file    Inability: Not on file  . Transportation needs    Medical: Not on file    Non-medical: Not on file  Tobacco Use  . Smoking status: Never Smoker  . Smokeless tobacco: Never Used  Substance and Sexual Activity  . Alcohol use: No  . Drug use: No  . Sexual  activity: Yes    Birth control/protection: Surgical  Lifestyle  . Physical activity    Days per week: Not on file    Minutes per session: Not on file  . Stress: Not on file  Relationships  . Social Musician on phone: Not on file    Gets together: Not on file    Attends religious service: Not on file    Active member of club or organization: Not on file    Attends meetings of clubs or organizations: Not on file    Relationship status: Not on file  . Intimate partner violence    Fear of current or ex partner: Not on file    Emotionally abused: Not on file    Physically abused: Not on file    Forced sexual activity: Not on file  Other Topics Concern  . 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     Past Surgical History:  Procedure Laterality Date  . CESAREAN SECTION  2009  . CESAREAN SECTION WITH BILATERAL TUBAL LIGATION Bilateral 2017    Family History  Problem Relation  Age of Onset  . Hypertension Father     No Known Allergies  Current Outpatient Medications on File Prior to Visit  Medication Sig Dispense Refill  . amLODipine (NORVASC) 5 MG tablet Take 1 tablet by mouth once daily 90 tablet 0  . betamethasone dipropionate (DIPROLENE) 0.05 % cream Apply topically 2 (two) times daily. 30 g 0  . carvedilol (COREG) 12.5 MG tablet Take 1 tablet by mouth twice daily 180 tablet 0   No current facility-administered medications on file prior to visit.     BP (!) 131/95 (BP Location: Right Arm, Patient Position: Sitting, Cuff Size: Small)   Pulse 69   Temp (!) 97.5 F (36.4 C) (Temporal)   Resp 16   Ht 4\' 10"  (1.473 m)   Wt 155 lb (70.3 kg)   SpO2 100%   BMI 32.40 kg/m    Objective:   Physical Exam  Physical Exam  Constitutional: She is oriented to person, place, and time.  She appears well-developed and well-nourished. No distress.  HENT:  Head: Normocephalic and atraumatic.  Right Ear: Tympanic membrane and ear canal normal.  Left Ear: Tympanic membrane and ear canal normal.  Mouth/Throat: Not examined. Pt wearing mask Eyes: Pupils are equal, round, and reactive to light. No scleral icterus.  Neck: Normal range of motion. No thyromegaly present.  Cardiovascular: Normal rate and regular rhythm.   No murmur heard. Pulmonary/Chest: Effort normal and breath sounds normal. No respiratory distress. He has no wheezes. She has no rales. She exhibits no tenderness.  Abdominal: Soft. Bowel sounds are normal. She exhibits no distension and no mass. There is no tenderness. There is no rebound and no guarding.  Musculoskeletal: She exhibits no edema.  Lymphadenopathy:    She has no cervical adenopathy.  Neurological: She is alert and oriented to person, place, and time. She has normal patellar reflexes. She exhibits normal muscle tone. Coordination normal.  Skin: Skin is warm and dry.  Psychiatric: She has a normal mood and affect. Her behavior is normal. Judgment and thought content normal.  Breasts: Examined lying Right: Without masses, retractions, discharge or axillary adenopathy.  Left: Without masses, retractions, discharge or axillary adenopathy.  Pelvic: deferred            Assessment & Plan:    Preventative care- discussed healthy diet, exercise and weight loss. Refer for mammogram, pap (gyn), obtain routine lab work.  Flu shot up to date.  Tdap up to date.       Assessment & Plan:  HTN- DBP is a bit elevated today. I have asked the pt to check her blood pressure at work once daily x 3 days and send me her readings via my chart. If bp remains elevated would plan increase dose of amlodipine.  BP Readings from Last 3 Encounters:  11/13/18 (!) 131/95  07/11/18 132/80  03/12/18 (!) 142/91

## 2018-11-13 NOTE — Patient Instructions (Signed)
Please complete lab work prior to leaving.   

## 2018-11-14 NOTE — Telephone Encounter (Signed)
Lvm again today for patient to call back about her labs, also sent out a mychart message.

## 2018-11-14 NOTE — Telephone Encounter (Signed)
Patient advised of results, she reports she does not drink alcohol.  Was scheduled for labs next Monday at her request.

## 2018-11-19 ENCOUNTER — Encounter (HOSPITAL_BASED_OUTPATIENT_CLINIC_OR_DEPARTMENT_OTHER): Payer: Self-pay

## 2018-11-19 ENCOUNTER — Other Ambulatory Visit: Payer: Self-pay

## 2018-11-19 ENCOUNTER — Other Ambulatory Visit (INDEPENDENT_AMBULATORY_CARE_PROVIDER_SITE_OTHER): Payer: No Typology Code available for payment source

## 2018-11-19 ENCOUNTER — Ambulatory Visit (HOSPITAL_BASED_OUTPATIENT_CLINIC_OR_DEPARTMENT_OTHER)
Admission: RE | Admit: 2018-11-19 | Discharge: 2018-11-19 | Disposition: A | Discharge status: Home / Self Care | Payer: No Typology Code available for payment source | Source: Ambulatory Visit | Attending: Family | Admitting: Family

## 2018-11-19 DIAGNOSIS — R945 Abnormal results of liver function studies: Secondary | ICD-10-CM

## 2018-11-19 DIAGNOSIS — N6489 Other specified disorders of breast: Secondary | ICD-10-CM | POA: Insufficient documentation

## 2018-11-19 DIAGNOSIS — Z0000 Encounter for general adult medical examination without abnormal findings: Secondary | ICD-10-CM

## 2018-11-19 DIAGNOSIS — Z1231 Encounter for screening mammogram for malignant neoplasm of breast: Secondary | ICD-10-CM | POA: Insufficient documentation

## 2018-11-19 DIAGNOSIS — R7989 Other specified abnormal findings of blood chemistry: Secondary | ICD-10-CM

## 2018-11-19 LAB — FERRITIN: Ferritin: 25.7 ng/mL (ref 10.0–291.0)

## 2018-11-19 LAB — GAMMA GT: GGT: 159 U/L — ABNORMAL HIGH (ref 7–51)

## 2018-11-20 LAB — HEPATITIS PANEL, ACUTE
Hep A IgM: NONREACTIVE
Hep B C IgM: NONREACTIVE
Hepatitis B Surface Ag: NONREACTIVE
Hepatitis C Ab: NONREACTIVE
SIGNAL TO CUT-OFF: 0.02 (ref <1.00)

## 2018-11-21 ENCOUNTER — Other Ambulatory Visit: Payer: Self-pay | Admitting: Family

## 2018-11-21 ENCOUNTER — Encounter: Payer: Self-pay | Admitting: Family

## 2018-11-21 DIAGNOSIS — R928 Other abnormal and inconclusive findings on diagnostic imaging of breast: Secondary | ICD-10-CM

## 2018-12-26 ENCOUNTER — Encounter: Payer: No Typology Code available for payment source | Admitting: Obstetrics & Gynecology

## 2019-01-30 ENCOUNTER — Encounter: Payer: No Typology Code available for payment source | Admitting: Obstetrics & Gynecology

## 2019-02-20 ENCOUNTER — Other Ambulatory Visit: Payer: Self-pay | Admitting: Family

## 2019-02-21 ENCOUNTER — Other Ambulatory Visit: Payer: Self-pay | Admitting: Family

## 2019-05-25 ENCOUNTER — Other Ambulatory Visit: Payer: Self-pay | Admitting: Family

## 2019-05-29 ENCOUNTER — Encounter: Payer: Self-pay | Admitting: Family Medicine

## 2019-05-29 ENCOUNTER — Ambulatory Visit (INDEPENDENT_AMBULATORY_CARE_PROVIDER_SITE_OTHER): Payer: No Typology Code available for payment source | Admitting: Family Medicine

## 2019-05-29 ENCOUNTER — Other Ambulatory Visit: Payer: Self-pay

## 2019-05-29 VITALS — BP 120/84 | HR 81 | Temp 96.7°F | Ht <= 58 in | Wt 159.2 lb

## 2019-05-29 DIAGNOSIS — I10 Essential (primary) hypertension: Secondary | ICD-10-CM

## 2019-05-29 DIAGNOSIS — R002 Palpitations: Secondary | ICD-10-CM | POA: Diagnosis not present

## 2019-05-29 NOTE — Patient Instructions (Addendum)
If you do not hear anything about your referral in the next 1-2 weeks, call our office and ask for an update.  Stay hydrated.  Keep the diet clean and stay active.  Bring your blood pressure monitor to your next visit.  If you need a refill before your follow up, OK to ask me and I will refill.   Let us know if you need anything.

## 2019-05-29 NOTE — Progress Notes (Signed)
Chief Complaint  Patient presents with  . Heart Problem    Bridget Rodriguez is a 39 y.o. female here for palpitations.  Length of issue: 1 year, worse over past couple mo How long does it last: several seconds Light headedness/passing out? No Chest pain/shortness of breath? No Hx of arrhythmia? No Medication changes/illicit substances? No  Caffeine/EtOH? No Was going to have Holter but it was not done 2/2 Covid.   Hypertension Patient presents for hypertension follow up. She does monitor home blood pressures. Blood pressures ranging on average from 130's/90's. She is compliant with medications- Norvasc 5 mg/d, Coreg 12.5 mg bid. Patient has these side effects of medication: none She is adhering to a healthy diet overall. Exercise: some walking  Past Medical History:  Diagnosis Date  . Fatty liver   . Hypertension   . Pelvic mass 2017   Benign pelvic floor mass per pt.   Past Surgical History:  Procedure Laterality Date  . CESAREAN SECTION  2009  . CESAREAN SECTION WITH BILATERAL TUBAL LIGATION Bilateral 2017   No Known Allergies Allergies as of 05/29/2019   No Known Allergies     Medication List       Accurate as of May 29, 2019  9:59 AM. If you have any questions, ask your nurse or doctor.        STOP taking these medications   betamethasone dipropionate 0.05 % cream Stopped by: Sharlene Dory, DO     TAKE these medications   amLODipine 5 MG tablet Commonly known as: NORVASC Take 1 tablet (5 mg total) by mouth daily.   carvedilol 12.5 MG tablet Commonly known as: COREG Take 1 tablet (12.5 mg total) by mouth 2 (two) times daily.       BP 120/84 (BP Location: Left Arm, Patient Position: Sitting, Cuff Size: Normal)   Pulse 81   Temp (!) 96.7 F (35.9 C) (Temporal)   Ht 4\' 10"  (1.473 m)   Wt 159 lb 4 oz (72.2 kg)   SpO2 97%   BMI 33.28 kg/m  Gen: Awake, alert, appearing stated age Eyes: PERRLA Mouth: MMM Heart: RRR, no bruits, no LE  edema Lungs: CTAB, no accessory muscle use Neuro: No cerebellar signs MSK: No muscle group atrophy or asymmetry Psych: Age appropriate judgment and insight, mood/affect WNL  Palpitations - Plan: Ambulatory referral to Cardiology  Essential hypertension  She has had workup and currently asymptomatic. Refer to cards per protocol for longer term monitoring.  Cont monitoring BP and losing wt. Bring BP monitor to next appt, to return in 6 weeks for BP ck w reg PCP.  The patient voiced understanding and agreement to the plan.  Uday Jantz 9:59 AM 05/29/19

## 2019-06-02 ENCOUNTER — Other Ambulatory Visit: Payer: Self-pay | Admitting: Family

## 2019-06-07 ENCOUNTER — Ambulatory Visit (INDEPENDENT_AMBULATORY_CARE_PROVIDER_SITE_OTHER): Payer: No Typology Code available for payment source

## 2019-06-07 ENCOUNTER — Other Ambulatory Visit: Payer: Self-pay

## 2019-06-07 ENCOUNTER — Encounter: Payer: Self-pay | Admitting: Cardiology

## 2019-06-07 ENCOUNTER — Ambulatory Visit (INDEPENDENT_AMBULATORY_CARE_PROVIDER_SITE_OTHER): Payer: No Typology Code available for payment source | Admitting: Cardiology

## 2019-06-07 VITALS — BP 122/78 | HR 65 | Ht <= 58 in | Wt 157.0 lb

## 2019-06-07 DIAGNOSIS — I10 Essential (primary) hypertension: Secondary | ICD-10-CM | POA: Diagnosis not present

## 2019-06-07 DIAGNOSIS — R002 Palpitations: Secondary | ICD-10-CM

## 2019-06-07 NOTE — Patient Instructions (Signed)
Medication Instructions:  °Your physician recommends that you continue on your current medications as directed. Please refer to the Current Medication list given to you today. ° °*If you need a refill on your cardiac medications before your next appointment, please call your pharmacy* ° ° °Lab Work: °None. ° °If you have labs (blood work) drawn today and your tests are completely normal, you will receive your results only by: °• MyChart Message (if you have MyChart) OR °• A paper copy in the mail °If you have any lab test that is abnormal or we need to change your treatment, we will call you to review the results. ° ° °Testing/Procedures: °Your physician has requested that you have an echocardiogram. Echocardiography is a painless test that uses sound waves to create images of your heart. It provides your doctor with information about the size and shape of your heart and how well your heart’s chambers and valves are working. This procedure takes approximately one hour. There are no restrictions for this procedure. ° °A zio monitor was ordered today. It will remain on for 14 days. You will then return monitor and event diary in provided box. It takes 1-2 weeks for report to be downloaded and returned to us. We will call you with the results. If monitor falls off or has orange flashing light, please call Zio for further instructions.  ° ° ° ° °Follow-Up: °At [facility], you and your health needs are our priority.  As part of our continuing mission to provide you with exceptional heart care, we have created designated Provider Care Teams.  These Care Teams include your primary Cardiologist (physician) and Advanced Practice Providers (APPs -  Physician Assistants and Nurse Practitioners) who all work together to provide you with the care you need, when you need it. ° °We recommend signing up for the patient portal called "MyChart".  Sign up information is provided on this After Visit Summary.  MyChart is used to  connect with patients for Virtual Visits (Telemedicine).  Patients are able to view lab/test results, encounter notes, upcoming appointments, etc.  Non-urgent messages can be sent to your provider as well.   °To learn more about what you can do with MyChart, go to [email or web address].   ° °Your next appointment:   °3 month(s) ° °The format for your next appointment:   °In Person ° °Provider:   °Kardie Tobb, DO ° ° °Other Instructions ° ° °Echocardiogram °An echocardiogram is a procedure that uses painless sound waves (ultrasound) to produce an image of the heart. Images from an echocardiogram can provide important information about: °· Signs of coronary artery disease (CAD). °· Aneurysm detection. An aneurysm is a weak or damaged part of an artery wall that bulges out from the normal force of blood pumping through the body. °· Heart size and shape. Changes in the size or shape of the heart can be associated with certain conditions, including heart failure, aneurysm, and CAD. °· Heart muscle function. °· Heart valve function. °· Signs of a past heart attack. °· Fluid buildup around the heart. °· Thickening of the heart muscle. °· A tumor or infectious growth around the heart valves. °Tell a health care provider about: °· Any allergies you have. °· All medicines you are taking, including vitamins, herbs, eye drops, creams, and over-the-counter medicines. °· Any blood disorders you have. °· Any surgeries you have had. °· Any medical conditions you have. °· Whether you are pregnant or may be pregnant. °What are the risks? °  Generally, this is a safe procedure. However, problems may occur, including: °· Allergic reaction to dye (contrast) that may be used during the procedure. °What happens before the procedure? °No specific preparation is needed. You may eat and drink normally. °What happens during the procedure? ° °· An IV tube may be inserted into one of your veins. °· You may receive contrast through this tube. A  contrast is an injection that improves the quality of the pictures from your heart. °· A gel will be applied to your chest. °· A wand-like tool (transducer) will be moved over your chest. The gel will help to transmit the sound waves from the transducer. °· The sound waves will harmlessly bounce off of your heart to allow the heart images to be captured in real-time motion. The images will be recorded on a computer. °The procedure may vary among health care providers and hospitals. °What happens after the procedure? °· You may return to your normal, everyday life, including diet, activities, and medicines, unless your health care provider tells you not to do that. °Summary °· An echocardiogram is a procedure that uses painless sound waves (ultrasound) to produce an image of the heart. °· Images from an echocardiogram can provide important information about the size and shape of your heart, heart muscle function, heart valve function, and fluid buildup around your heart. °· You do not need to do anything to prepare before this procedure. You may eat and drink normally. °· After the echocardiogram is completed, you may return to your normal, everyday life, unless your health care provider tells you not to do that. °This information is not intended to replace advice given to you by your health care provider. Make sure you discuss any questions you have with your health care provider. °Document Revised: 05/17/2018 Document Reviewed: 02/27/2016 °Elsevier Patient Education © 2020 [employer] ° ° °

## 2019-06-07 NOTE — Progress Notes (Signed)
Cardiology Office Note:    Date:  06/08/2019   ID:  Bridget Rodriguez, DOB Jun 20, 1980, MRN [id number]  PCP:  Sandford Craze, NP  Cardiologist:  No primary care provider on file.  Electrophysiologist:  None   Referring MD: Sharlene Dory*   Chief Complaint  Patient presents with  . New Patient (Initial Visit)    History of Present Illness:    Bridget Rodriguez is a 39 y.o. female with a hx of hypertension which was diagnosed 7 to 8 years ago she has been on amlodipine and carvedilol.  This appears to be controlling her blood pressure.  The patient was referred by her primary care provider because she has been complaining of intermittent palpitations.  She tells me few more like skipped beats.  She notes that is usually on exertion where she feels sometimes rapid heartbeat that lasts a few minutes and resolve.  But other times it feels as increasing skipped beats.  She has not had any chest pain, shortness of breath or lightheadedness or dizziness.  She is concerned about the recurrence of these skipped beats therefore she was referred to see cardiology.   Past Medical History:  Diagnosis Date  . Fatty liver   . Hypertension   . Pelvic mass 2017   Benign pelvic floor mass per pt.    Past Surgical History:  Procedure Laterality Date  . CESAREAN SECTION  2009  . CESAREAN SECTION WITH BILATERAL TUBAL LIGATION Bilateral 2017    Current Medications: Current Meds  Medication Sig  . amLODipine (NORVASC) 5 MG tablet Take 1 tablet by mouth once daily  . carvedilol (COREG) 12.5 MG tablet Take 1 tablet (12.5 mg total) by mouth 2 (two) times daily.     Allergies:   Patient has no known allergies.   Social History   Socioeconomic History  . Marital status: Married    Spouse name: Not on file  . Number of children: Not on file  . Years of education: Not on file  . Highest education level: Not on file  Occupational History  . Not on file  Tobacco Use  . Smoking  status: Never Smoker  . Smokeless tobacco: Never Used  Substance and Sexual Activity  . Alcohol use: No  . Drug use: No  . Sexual activity: Yes    Birth control/protection: Surgical  Other Topics Concern  . 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    Social Determinants of Health   Financial Resource Strain:   . Difficulty of Paying Living Expenses:   Food Insecurity:   . Worried About Programme researcher, broadcasting/film/video in the Last Year:   . Barista in the Last Year:   Transportation Needs:   . Freight forwarder (Medical):   Marland Kitchen Lack of Transportation (Non-Medical):   Physical Activity:   . Days of Exercise per Week:   . Minutes of Exercise per Session:   Stress:   . Feeling of Stress :   Social Connections:   . Frequency of Communication with Friends and Family:   . Frequency of Social Gatherings with Friends and Family:   . Attends Religious Services:   . Active Member of Clubs or Organizations:   . Attends Club or  Organization Meetings:   Marland Kitchen Marital Status:      Family History: The patient's family history includes Hypertension in her father.  ROS:   Review of Systems  Constitution: Negative for decreased appetite, fever and weight gain.  HENT: Negative for congestion, ear discharge, hoarse voice and sore throat.   Eyes: Negative for discharge, redness, vision loss in right eye and visual halos.  Cardiovascular: Negative for chest pain, dyspnea on exertion, leg swelling, orthopnea and palpitations.  Respiratory: Negative for cough, hemoptysis, shortness of breath and snoring.   Endocrine: Negative for heat intolerance and polyphagia.  Hematologic/Lymphatic: Negative for bleeding problem. Does not bruise/bleed easily.  Skin: Negative for flushing, nail changes, rash and suspicious lesions.    Musculoskeletal: Negative for arthritis, joint pain, muscle cramps, myalgias, neck pain and stiffness.  Gastrointestinal: Negative for abdominal pain, bowel incontinence, diarrhea and excessive appetite.  Genitourinary: Negative for decreased libido, genital sores and incomplete emptying.  Neurological: Negative for brief paralysis, focal weakness, headaches and loss of balance.  Psychiatric/Behavioral: Negative for altered mental status, depression and suicidal ideas.  Allergic/Immunologic: Negative for HIV exposure and persistent infections.    EKGs/Labs/Other Studies Reviewed:    The following studies were reviewed today:   EKG:  The ekg ordered today demonstrates sinus rhythm, heart rate 65 bpm compared to EKG done in June 2020 no significant change.  Recent Labs: 07/13/2018: Magnesium 2.2 11/13/2018: ALT 61; BUN 14; Creatinine, Ser 0.65; Hemoglobin 12.6; Platelets 323.0; Potassium 3.8; Sodium 138; TSH 3.09  Recent Lipid Panel    Component Value Date/Time   CHOL 187 11/13/2018 1002   TRIG 118.0 11/13/2018 1002   HDL 60.50 11/13/2018 1002   CHOLHDL 3 11/13/2018 1002   VLDL 23.6 11/13/2018 1002   LDLCALC 103 (H) 11/13/2018 1002    Physical Exam:    VS:  BP 122/78   Pulse 65   Ht 4\' 10"  (1.473 m)   Wt 157 lb (71.2 kg)   BMI 32.81 kg/m     Wt Readings from Last 3 Encounters:  06/07/19 157 lb (71.2 kg)  05/29/19 159 lb 4 oz (72.2 kg)  11/13/18 155 lb (70.3 kg)     GEN: Well nourished, well developed in no acute distress HEENT: Normal NECK: No JVD; No carotid bruits LYMPHATICS: No lymphadenopathy CARDIAC: S1S2 noted,RRR, no murmurs, rubs, gallops RESPIRATORY:  Clear to auscultation without rales, wheezing or rhonchi  ABDOMEN: Soft, non-tender, non-distended, +bowel sounds, no guarding. EXTREMITIES: No edema, No cyanosis, no clubbing MUSCULOSKELETAL:  No deformity  SKIN: Warm and dry NEUROLOGIC:  Alert and oriented x 3, non-focal PSYCHIATRIC:  Normal affect, good  insight  ASSESSMENT:    1. Palpitations   2. Essential hypertension    PLAN:     I would like to rule out a cardiovascular etiology of this palpitation, therefore at this time I would like to placed a zio patch for 14 days. In additon a transthoracic echocardiogram will be ordered to assess LV/RV function and any structural abnormalities. Once these testing have been performed amd reviewed further reccomendations will be made. For now, I do reccomend that the patient goes to the nearest ED if  symptoms recur.  Hypertension- her blood pressure is acceptable, no changes will be made to her antihypertensives.  The patient is in agreement with the above plan. The patient left the office in stable condition.  The patient will follow up in 3 months or sooner if needed.   Medication Adjustments/Labs and Tests Ordered: Current medicines are  reviewed at length with the patient today.  Concerns regarding medicines are outlined above.  Orders Placed This Encounter  Procedures  . LONG TERM MONITOR (3-14 DAYS)  . EKG 12-Lead  . ECHOCARDIOGRAM COMPLETE   No orders of the defined types were placed in this encounter.   Patient Instructions  Medication Instructions:  Your physician recommends that you continue on your current medications as directed. Please refer to the Current Medication list given to you today.  *If you need a refill on your cardiac medications before your next appointment, please call your pharmacy*   Lab Work: None.  If you have labs (blood work) drawn today and your tests are completely normal, you will receive your results only by: Marland Kitchen. MyChart Message (if you have MyChart) OR . A paper copy in the mail If you have any lab test that is abnormal or we need to change your treatment, we will call you to review the results.   Testing/Procedures: Your physician has requested that you have an echocardiogram. Echocardiography is a painless test that uses sound waves to create  images of your heart. It provides your doctor with information about the size and shape of your heart and how well your heart's chambers and valves are working. This procedure takes approximately one hour. There are no restrictions for this procedure.  .A zio monitor was ordered today. It will remain on for 14 days. You will then return monitor and event diary in provided box. It takes 1-2 weeks for report to be downloaded and returned to us. We will call you with the results. If monitor falls off or has orange flashing light, please call Zio for further instructions.      Follow-Up: At [facility], you and your health needs are our priority.  As part of our continuing mission to provide you with exceptional heart care, we have created designated Provider Care Teams.  These Care Teams include your primary Cardiologist (physician) and Advanced Practice Providers (APPs -  Physician Assistants and Nurse Practitioners) who all work together to provide you with the care you need, when you need it.  We recommend signing up for the patient portal called "MyChart".  Sign up information is provided on this After Visit Summary.  MyChart is used to connect with patients for Virtual Visits (Telemedicine).  Patients are able to view lab/test results, encounter notes, upcoming appointments, etc.  Non-urgent messages can be sent to your provider as well.   To learn more about what you can do with MyChart, go to [email or web address].    Your next appointment:   3 month(s)  The format for your next appointment:   In Person  Provider:   Thomasene RippleKardie Wendelin Bradt, DO   Other Instructions   Echocardiogram An echocardiogram is a procedure that uses painless sound waves (ultrasound) to produce an image of the heart. Images from an echocardiogram can provide important information about:  Signs of coronary artery disease (CAD).  Aneurysm detection. An aneurysm is a weak or damaged part of an artery wall that bulges  out from the normal force of blood pumping through the body.  Heart size and shape. Changes in the size or shape of the heart can be associated with certain conditions, including heart failure, aneurysm, and CAD.  Heart muscle function.  Heart valve function.  Signs of a past heart attack.  Fluid buildup around the heart.  Thickening of the heart muscle.  A tumor or infectious growth around the heart valves. Tell a  health care provider about:  Any allergies you have.  All medicines you are taking, including vitamins, herbs, eye drops, creams, and over-the-counter medicines.  Any blood disorders you have.  Any surgeries you have had.  Any medical conditions you have.  Whether you are pregnant or may be pregnant. What are the risks? Generally, this is a safe procedure. However, problems may occur, including:  Allergic reaction to dye (contrast) that may be used during the procedure. What happens before the procedure? No specific preparation is needed. You may eat and drink normally. What happens during the procedure?   An IV tube may be inserted into one of your veins.  You may receive contrast through this tube. A contrast is an injection that improves the quality of the pictures from your heart.  A gel will be applied to your chest.  A wand-like tool (transducer) will be moved over your chest. The gel will help to transmit the sound waves from the transducer.  The sound waves will harmlessly bounce off of your heart to allow the heart images to be captured in real-time motion. The images will be recorded on a computer. The procedure may vary among health care providers and hospitals. What happens after the procedure?  You may return to your normal, everyday life, including diet, activities, and medicines, unless your health care provider tells you not to do that. Summary  An echocardiogram is a procedure that uses painless sound waves (ultrasound) to produce an  image of the heart.  Images from an echocardiogram can provide important information about the size and shape of your heart, heart muscle function, heart valve function, and fluid buildup around your heart.  You do not need to do anything to prepare before this procedure. You may eat and drink normally.  After the echocardiogram is completed, you may return to your normal, everyday life, unless your health care provider tells you not to do that. This information is not intended to replace advice given to you by your health care provider. Make sure you discuss any questions you have with your health care provider. Document Revised: 05/17/2018 Document Reviewed: 02/27/2016 Elsevier Patient Education  2020 [employer].      Adopting a Healthy Lifestyle.  Know what a healthy weight is for you (roughly BMI <25) and aim to maintain this   Aim for 7+ servings of fruits and vegetables daily   65-80+ fluid ounces of water or unsweet tea for healthy kidneys   Limit to max 1 drink of alcohol per day; avoid smoking/tobacco   Limit animal fats in diet for cholesterol and heart health - choose grass fed whenever available   Avoid highly processed foods, and foods high in saturated/trans fats   Aim for low stress - take time to unwind and care for your mental health   Aim for 150 min of moderate intensity exercise weekly for heart health, and weights twice weekly for bone health   Aim for 7-9 hours of sleep daily   When it comes to diets, agreement about the perfect plan isnt easy to find, even among the experts. Experts at the [facility] [employer] developed an idea known as the Healthy Eating Plate. Just imagine a plate divided into logical, healthy portions.   The emphasis is on diet quality:   Load up on vegetables and fruits - one-half of your plate: Aim for color and variety, and remember that potatoes dont count.   Go for whole grains - one-quarter  of your plate: Whole  wheat, barley, wheat berries, quinoa, oats, brown rice, and foods made with them. If you want pasta, go with whole wheat pasta.   Protein power - one-quarter of your plate: Fish, chicken, beans, and nuts are all healthy, versatile protein sources. Limit red meat.   The diet, however, does go beyond the plate, offering a few other suggestions.   Use healthy plant oils, such as olive, canola, soy, corn, sunflower and peanut. Check the labels, and avoid partially hydrogenated oil, which have unhealthy trans fats.   If youre thirsty, drink water. Coffee and tea are good in moderation, but skip sugary drinks and limit milk and dairy products to one or two daily servings.   The type of carbohydrate in the diet is more important than the amount. Some sources of carbohydrates, such as vegetables, fruits, whole grains, and beans-are healthier than others.   Finally, stay active  Signed, Berniece Salines, DO  06/08/2019 1:12 PM    [facility]

## 2019-06-13 ENCOUNTER — Other Ambulatory Visit (HOSPITAL_BASED_OUTPATIENT_CLINIC_OR_DEPARTMENT_OTHER): Payer: No Typology Code available for payment source

## 2019-06-14 ENCOUNTER — Other Ambulatory Visit: Payer: Self-pay

## 2019-06-14 ENCOUNTER — Ambulatory Visit (HOSPITAL_BASED_OUTPATIENT_CLINIC_OR_DEPARTMENT_OTHER)
Admission: RE | Admit: 2019-06-14 | Discharge: 2019-06-14 | Disposition: A | Discharge status: Home / Self Care | Payer: No Typology Code available for payment source | Source: Ambulatory Visit | Attending: Cardiology | Admitting: Cardiology

## 2019-06-14 DIAGNOSIS — R002 Palpitations: Secondary | ICD-10-CM | POA: Insufficient documentation

## 2019-06-14 NOTE — Progress Notes (Signed)
  Echocardiogram 2D Echocardiogram has been performed.  Bridget Rodriguez 06/14/2019, 2:45 PM

## 2019-06-17 ENCOUNTER — Telehealth: Payer: Self-pay

## 2019-06-17 NOTE — Telephone Encounter (Signed)
Left message on patients voicemail to please return our call.   

## 2019-06-17 NOTE — Telephone Encounter (Signed)
-----   Message from Thomasene Ripple, DO sent at 06/15/2019  2:22 PM EDT ----- Normal echo.  Please forward copy to PCP

## 2019-06-18 NOTE — Telephone Encounter (Signed)
Left message on patients voicemail to please return our call.   

## 2019-07-11 ENCOUNTER — Telehealth: Payer: Self-pay

## 2019-07-11 NOTE — Telephone Encounter (Signed)
Left message on patients voicemail to please return our call.   

## 2019-07-11 NOTE — Telephone Encounter (Signed)
-----   Message from Thomasene Ripple, DO sent at 07/10/2019  5:29 PM EDT ----- Your monitor shows that you do have symptomatic PVCs were discussed in great detail about medications changes when I see you at your next visit.

## 2019-07-12 ENCOUNTER — Telehealth: Payer: Self-pay

## 2019-07-12 NOTE — Telephone Encounter (Signed)
Left message on patients voicemail to please return our call.   

## 2019-07-12 NOTE — Telephone Encounter (Signed)
-----   Message from Kardie Tobb, DO sent at 07/10/2019  5:29 PM EDT ----- °Your monitor shows that you do have symptomatic PVCs were discussed in great detail about medications changes when I see you at your next visit. °

## 2019-07-15 ENCOUNTER — Telehealth: Payer: Self-pay

## 2019-07-15 NOTE — Telephone Encounter (Signed)
Left message on patients voicemail to please return our call. I am also sending a letter to the patient after failing to reach her via telephone x3.

## 2019-07-15 NOTE — Telephone Encounter (Signed)
-----   Message from Kardie Tobb, DO sent at 07/10/2019  5:29 PM EDT ----- °Your monitor shows that you do have symptomatic PVCs were discussed in great detail about medications changes when I see you at your next visit. °

## 2019-07-19 ENCOUNTER — Other Ambulatory Visit: Payer: Self-pay | Admitting: Family

## 2019-07-20 ENCOUNTER — Telehealth: Payer: No Typology Code available for payment source

## 2019-08-30 ENCOUNTER — Other Ambulatory Visit: Payer: Self-pay

## 2019-08-30 ENCOUNTER — Encounter: Payer: Self-pay | Admitting: Cardiology

## 2019-08-30 ENCOUNTER — Ambulatory Visit (INDEPENDENT_AMBULATORY_CARE_PROVIDER_SITE_OTHER): Payer: No Typology Code available for payment source | Admitting: Cardiology

## 2019-08-30 VITALS — BP 112/68 | HR 64 | Ht <= 58 in | Wt 160.0 lb

## 2019-08-30 DIAGNOSIS — I493 Ventricular premature depolarization: Secondary | ICD-10-CM

## 2019-08-30 DIAGNOSIS — I10 Essential (primary) hypertension: Secondary | ICD-10-CM

## 2019-08-30 DIAGNOSIS — E669 Obesity, unspecified: Secondary | ICD-10-CM

## 2019-08-30 DIAGNOSIS — E782 Mixed hyperlipidemia: Secondary | ICD-10-CM | POA: Diagnosis not present

## 2019-08-30 NOTE — Progress Notes (Signed)
Cardiology Office Note:    Date:  08/30/2019   ID:  Bridget Rodriguez, DOB [birth year], MRN [id number]  PCP:  Sandford Craze, NP  Cardiologist:  No primary care provider on file.  Electrophysiologist:  None   Referring MD: Sandford Craze, NP   "I am doing well"  History of Present Illness:    Bridget Rodriguez is a 39 y.o. female with a hx of hypertension on Amlodipine and carvedilol. I last saw the patient April 30,2021.  At that time she was experiencing palpitations. At the conclusion of her visit I recommended patient wear a monitor as well as get an echocardiogram done.  She was able to get this testing done prior to her her presentation today.  Past Medical History:  Diagnosis Date  . Eczema 10/28/2016  . Essential hypertension 10/28/2016  . Fatty liver   . Hypertension   . Pelvic mass 2017   Benign pelvic floor mass per pt.    Past Surgical History:  Procedure Laterality Date  . CESAREAN SECTION  2009  . CESAREAN SECTION WITH BILATERAL TUBAL LIGATION Bilateral 2017    Current Medications: Current Meds  Medication Sig  . amLODipine (NORVASC) 5 MG tablet Take 1 tablet by mouth once daily  . carvedilol (COREG) 12.5 MG tablet Take 1 tablet by mouth twice daily     Allergies:   Patient has no known allergies.   Social History   Socioeconomic History  . Marital status: Married    Spouse name: Not on file  . Number of children: Not on file  . Years of education: Not on file  . Highest education level: Not on file  Occupational History  . Not on file  Tobacco Use  . Smoking status: Never Smoker  . Smokeless tobacco: Never Used  Vaping Use  . Vaping Use: Never used  Substance and Sexual Activity  . Alcohol use: No  . Drug use: No  . Sexual activity: Yes    Birth control/protection: Surgical  Other Topics Concern  . 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    Social Determinants of Health   Financial Resource Strain:   . Difficulty of Paying Living Expenses:   Food Insecurity:   . Worried About Programme researcher, broadcasting/film/video in the Last Year:   . Barista in the Last Year:   Transportation Needs:   . Freight forwarder (Medical):   Marland Kitchen Lack of Transportation (Non-Medical):   Physical Activity:   . Days of Exercise per Week:   . Minutes of Exercise per Session:   Stress:   . Feeling of Stress :   Social Connections:   . Frequency of Communication with Friends and Family:   . Frequency of Social Gatherings with Friends and Family:   . Attends Religious Services:   . Active Member of Clubs or Organizations:   . Attends Banker Meetings:   Marland Kitchen Marital Status:      Family History: The patient's family history includes Hypertension in her father.  ROS:   Review of Systems  Constitution: Negative for decreased appetite, fever and weight gain.  HENT: Negative for congestion, ear discharge, hoarse voice and sore throat.   Eyes: Negative for discharge, redness, vision  loss in right eye and visual halos.  Cardiovascular: Negative for chest pain, dyspnea on exertion, leg swelling, orthopnea and palpitations.  Respiratory: Negative for cough, hemoptysis, shortness of breath and snoring.   Endocrine: Negative for heat intolerance and polyphagia.  Hematologic/Lymphatic: Negative for bleeding problem. Does not bruise/bleed easily.  Skin: Negative for flushing, nail changes, rash and suspicious lesions.  Musculoskeletal: Negative for arthritis, joint pain, muscle cramps, myalgias, neck pain and stiffness.  Gastrointestinal: Negative for abdominal pain, bowel incontinence, diarrhea and excessive appetite.  Genitourinary: Negative for decreased libido, genital sores and incomplete emptying.  Neurological: Negative for brief paralysis, focal weakness,  headaches and loss of balance.  Psychiatric/Behavioral: Negative for altered mental status, depression and suicidal ideas.  Allergic/Immunologic: Negative for HIV exposure and persistent infections.    EKGs/Labs/Other Studies Reviewed:    The following studies were reviewed today:   EKG:  The ekg ordered today demonstrates   ZIO monitor Indication: Palpitations The minimum heart rate was 46 bpm, maximum heart rate was 132 bpm, and average heart rate was 76 bpm. Predominant underlying rhythm was Sinus Rhythm.   Premature atrial complexes were rare (<1.0%). Premature Ventricular complexes were rare (<1.0%). Ventricular Bigeminy and Trigeminy were present  No ventricular tachycardia, no pauses, No AV block, no supraventricular tachycardia and no atrial fibrillation present.  76 patient triggered events almost all associated with premature ventricular complex and 35 diary events almost all associated with premature ventricular complex  Conclusion: This study is remarkable for symptomatic premature ventricular complex.   Echo IMPRESSIONS  1. Left ventricular ejection fraction, by estimation, is 60 to 65%. The left ventricle has normal function. The left ventricle has no regional wall motion abnormalities. Left ventricular diastolic parameters were normal.  2. Right ventricular systolic function is normal. The right ventricular size is normal. There is normal pulmonary artery systolic pressure.  3. The mitral valve is normal in structure. No evidence of mitral valve regurgitation. No evidence of mitral stenosis.  4. The aortic valve is normal in structure. Aortic valve regurgitation is not visualized. No aortic stenosis is present.   Recent Labs: 11/13/2018: ALT 61; BUN 14; Creatinine, Ser 0.65; Hemoglobin 12.6; Platelets 323.0; Potassium 3.8; Sodium 138; TSH 3.09  Recent Lipid Panel    Component Value Date/Time   CHOL 187 11/13/2018 1002   TRIG 118.0 11/13/2018 1002   HDL  60.50 11/13/2018 1002   CHOLHDL 3 11/13/2018 1002   VLDL 23.6 11/13/2018 1002   LDLCALC 103 (H) 11/13/2018 1002    Physical Exam:    VS:  BP 112/68 (BP Location: Left Arm, Patient Position: Sitting)   Pulse 64   Ht 4\' 10"  (1.473 m)   Wt 160 lb (72.6 kg)   SpO2 98%   BMI 33.44 kg/m     Wt Readings from Last 3 Encounters:  08/30/19 160 lb (72.6 kg)  06/07/19 157 lb (71.2 kg)  05/29/19 159 lb 4 oz (72.2 kg)     GEN: Well nourished, well developed in no acute distress HEENT: Normal NECK: No JVD; No carotid bruits LYMPHATICS: No lymphadenopathy CARDIAC: S1S2 noted,RRR, no murmurs, rubs, gallops RESPIRATORY:  Clear to auscultation without rales, wheezing or rhonchi  ABDOMEN: Soft, non-tender, non-distended, +bowel sounds, no guarding. EXTREMITIES: No edema, No cyanosis, no clubbing MUSCULOSKELETAL:  No deformity  SKIN: Warm and dry NEUROLOGIC:  Alert and oriented x 3, non-focal PSYCHIATRIC:  Normal affect, good insight  ASSESSMENT:    1. Essential hypertension   2. Symptomatic PVCs   3.  Obesity (BMI 30-39.9)    PLAN:     She tells me that the symptoms for her PVCs have improved greatly.  At this time we will continue to monitor the patient no need for changing or adding any additional medication.  I was again able to review her testing results with her all of her questions has been answered.  Hypertension-her blood pressure deceptively in the office today. Hyperlipidemia continue with diet modification. Obesity-the patient understands the need to lose weight with diet and exercise. We have discussed specific strategies for this.  The patient is in agreement with the above plan. The patient left the office in stable condition.  The patient will follow up in as needed.   Medication Adjustments/Labs and Tests Ordered: Current medicines are reviewed at length with the patient today.  Concerns regarding medicines are outlined above.  No orders of the defined types were  placed in this encounter.  No orders of the defined types were placed in this encounter.   Patient Instructions  Medication Instructions:  None *If you need a refill on your cardiac medications before your next appointment, please call your pharmacy*   Lab Work: None If you have labs (blood work) drawn today and your tests are completely normal, you will receive your results only by: Marland Kitchen MyChart Message (if you have MyChart) OR . A paper copy in the mail If you have any lab test that is abnormal or we need to change your treatment, we will call you to review the results.   Testing/Procedures: None   Follow-Up: At [facility], you and your health needs are our priority.  As part of our continuing mission to provide you with exceptional heart care, we have created designated Provider Care Teams.  These Care Teams include your primary Cardiologist (physician) and Advanced Practice Providers (APPs -  Physician Assistants and Nurse Practitioners) who all work together to provide you with the care you need, when you need it.  We recommend signing up for the patient portal called "MyChart".  Sign up information is provided on this After Visit Summary.  MyChart is used to connect with patients for Virtual Visits (Telemedicine).  Patients are able to view lab/test results, encounter notes, upcoming appointments, etc.  Non-urgent messages can be sent to your provider as well.   To learn more about what you can do with MyChart, go to [email or web address].    Your next appointment:   Follow up as needed.  Provider:   Thomasene Ripple, DO      Adopting a Healthy Lifestyle.  Know what a healthy weight is for you (roughly BMI <25) and aim to maintain this   Aim for 7+ servings of fruits and vegetables daily   65-80+ fluid ounces of water or unsweet tea for healthy kidneys   Limit to max 1 drink of alcohol per day; avoid smoking/tobacco   Limit animal fats in diet for cholesterol and  heart health - choose grass fed whenever available   Avoid highly processed foods, and foods high in saturated/trans fats   Aim for low stress - take time to unwind and care for your mental health   Aim for 150 min of moderate intensity exercise weekly for heart health, and weights twice weekly for bone health   Aim for 7-9 hours of sleep daily   When it comes to diets, agreement about the perfect plan isnt easy to find, even among the experts. Experts at the [employer] developed  an idea known as the Healthy Eating Plate. Just imagine a plate divided into logical, healthy portions.   The emphasis is on diet quality:   Load up on vegetables and fruits - one-half of your plate: Aim for color and variety, and remember that potatoes dont count.   Go for whole grains - one-quarter of your plate: Whole wheat, barley, wheat berries, quinoa, oats, brown rice, and foods made with them. If you want pasta, go with whole wheat pasta.   Protein power - one-quarter of your plate: Fish, chicken, beans, and nuts are all healthy, versatile protein sources. Limit red meat.   The diet, however, does go beyond the plate, offering a few other suggestions.   Use healthy plant oils, such as olive, canola, soy, corn, sunflower and peanut. Check the labels, and avoid partially hydrogenated oil, which have unhealthy trans fats.   If youre thirsty, drink water. Coffee and tea are good in moderation, but skip sugary drinks and limit milk and dairy products to one or two daily servings.   The type of carbohydrate in the diet is more important than the amount. Some sources of carbohydrates, such as vegetables, fruits, whole grains, and beans-are healthier than others.   Finally, stay active  Signed, Thomasene RippleKardie Nikitia Asbill, DO  08/30/2019 2:57 PM    [facility]

## 2019-08-30 NOTE — Patient Instructions (Signed)
Medication Instructions:  None *If you need a refill on your cardiac medications before your next appointment, please call your pharmacy*   Lab Work: None If you have labs (blood work) drawn today and your tests are completely normal, you will receive your results only by: Marland Kitchen MyChart Message (if you have MyChart) OR . A paper copy in the mail If you have any lab test that is abnormal or we need to change your treatment, we will call you to review the results.   Testing/Procedures: None   Follow-Up: At [facility], you and your health needs are our priority.  As part of our continuing mission to provide you with exceptional heart care, we have created designated Provider Care Teams.  These Care Teams include your primary Cardiologist (physician) and Advanced Practice Providers (APPs -  Physician Assistants and Nurse Practitioners) who all work together to provide you with the care you need, when you need it.  We recommend signing up for the patient portal called "MyChart".  Sign up information is provided on this After Visit Summary.  MyChart is used to connect with patients for Virtual Visits (Telemedicine).  Patients are able to view lab/test results, encounter notes, upcoming appointments, etc.  Non-urgent messages can be sent to your provider as well.   To learn more about what you can do with MyChart, go to [email or web address].    Your next appointment:   Follow up as needed.  Provider:   Thomasene Ripple, DO

## 2019-10-24 ENCOUNTER — Other Ambulatory Visit: Payer: Self-pay | Admitting: Family

## 2019-10-24 MED ORDER — CARVEDILOL 12.5 MG PO TABS
12.5000 mg | ORAL_TABLET | Freq: Two times a day (BID) | ORAL | 0 refills | Status: DC
Start: 2019-10-24 — End: 2019-12-25

## 2019-10-24 MED ORDER — AMLODIPINE BESYLATE 5 MG PO TABS: 5.0000 mg | ORAL_TABLET | Freq: Every day | ORAL | 0 refills | Status: DC

## 2019-12-02 ENCOUNTER — Encounter: Payer: No Typology Code available for payment source | Admitting: Family

## 2019-12-10 ENCOUNTER — Ambulatory Visit (INDEPENDENT_AMBULATORY_CARE_PROVIDER_SITE_OTHER): Payer: No Typology Code available for payment source | Admitting: Family

## 2019-12-10 ENCOUNTER — Encounter: Payer: Self-pay | Admitting: Family

## 2019-12-10 ENCOUNTER — Ambulatory Visit: Payer: No Typology Code available for payment source | Attending: Internal Medicine

## 2019-12-10 ENCOUNTER — Other Ambulatory Visit: Payer: Self-pay

## 2019-12-10 ENCOUNTER — Other Ambulatory Visit (HOSPITAL_BASED_OUTPATIENT_CLINIC_OR_DEPARTMENT_OTHER): Payer: Self-pay | Admitting: Internal Medicine

## 2019-12-10 VITALS — BP 105/68 | HR 57 | Temp 98.1°F | Resp 16 | Ht <= 58 in | Wt 164.0 lb

## 2019-12-10 DIAGNOSIS — Z23 Encounter for immunization: Secondary | ICD-10-CM

## 2019-12-10 DIAGNOSIS — Z0000 Encounter for general adult medical examination without abnormal findings: Secondary | ICD-10-CM

## 2019-12-10 DIAGNOSIS — R928 Other abnormal and inconclusive findings on diagnostic imaging of breast: Secondary | ICD-10-CM | POA: Diagnosis not present

## 2019-12-10 DIAGNOSIS — R002 Palpitations: Secondary | ICD-10-CM

## 2019-12-10 DIAGNOSIS — I10 Essential (primary) hypertension: Secondary | ICD-10-CM

## 2019-12-10 NOTE — Progress Notes (Signed)
   Covid-19 Vaccination Clinic  Name:  CIONNA COLLANTES    MRN: [id number] DOB: 1980/05/30  12/10/2019  Ms. Humbarger was observed post Covid-19 immunization for 15 minutes without incident. She was provided with Vaccine Information Sheet and instruction to access the V-Safe system.   Ms. Roehr was instructed to call 911 with any severe reactions post vaccine: Marland Kitchen Difficulty breathing  . Swelling of face and throat  . A fast heartbeat  . A bad rash all over body  . Dizziness and weakness

## 2019-12-10 NOTE — Progress Notes (Signed)
Subjective:    Patient ID: Bridget Rodriguez, female    DOB: 1980/05/18, 39 y.o.   MRN: [id number]  HPI  Patient presents today for complete physical.  Immunizations: flu shot up to date. Pfizer x 2, Tdap up to date.  Will get pfizer #3 today.  Diet: diet is healthy Wt Readings from Last 3 Encounters:  12/10/19 164 lb (74.4 kg)  08/30/19 160 lb (72.6 kg)  06/07/19 157 lb (71.2 kg)  Exercise: walking 3-4 days a week over the summer.  Doing 2-3 since school back.  Pap Smear: 02/08/15- has period today Mammogram: 11/19/18- recommended additional imaging.    Review of Systems  Constitutional: Negative for unexpected weight change.  HENT: Negative for hearing loss and rhinorrhea.   Eyes: Negative for visual disturbance.  Respiratory: Negative for cough.   Cardiovascular: Positive for palpitations (symptomatic pvc's, this is being followed by cardiology). Negative for chest pain.  Gastrointestinal: Negative for blood in stool, constipation and diarrhea.  Genitourinary: Positive for menstrual problem (irregular menses). Negative for hematuria.  Musculoskeletal: Negative for arthralgias and myalgias.       Occasional right heel pain  Skin: Negative for rash.  Neurological: Negative for headaches.  Hematological: Negative for adenopathy.  Psychiatric/Behavioral:       Denies depression/anxiety       Past Medical History:  Diagnosis Date   Eczema 10/28/2016   Essential hypertension 10/28/2016   Fatty liver    Hypertension    Pelvic mass 2017   Benign pelvic floor mass per pt.     Social History   Socioeconomic History   Marital status: Married    Spouse name: Not on file   Number of children: Not on file   Years of education: Not on file   Highest education level: Not on file  Occupational History   Not on file  Tobacco Use   Smoking status: Never Smoker   Smokeless tobacco: Never Used  Vaping Use   Vaping Use: Never used  Substance and Sexual Activity    Alcohol use: No   Drug use: No   Sexual activity: Yes    Birth control/protection: Surgical  Other Topics Concern   Not on file  Social History Narrative   2 children    2017- daughter- Grover Canavan   2009- son- Caryn Bee   Married   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    Social Determinants of Health   Financial Resource Strain:    Difficulty of Paying Living Expenses: Not on file  Food Insecurity:    Worried About Programme researcher, broadcasting/film/video in the Last Year: Not on file   [employer] of Food in the Last Year: Not on file  Transportation Needs:    Lack of Transportation (Medical): Not on file   Lack of Transportation (Non-Medical): Not on file  Physical Activity:    Days of Exercise per Week: Not on file   Minutes of Exercise per Session: Not on file  Stress:    Feeling of Stress : Not on file  Social Connections:    Frequency of Communication with Friends and Family: Not on file   Frequency of Social Gatherings with Friends and Family: Not on file   Attends Religious Services: Not on file   Active Member of Clubs or Organizations: Not on file   Attends Banker Meetings: Not on file   Marital Status:  Not on file  Intimate Partner Violence:    Fear of Current or Ex-Partner: Not on file   Emotionally Abused: Not on file   Physically Abused: Not on file   Sexually Abused: Not on file    Past Surgical History:  Procedure Laterality Date   CESAREAN SECTION  2009   CESAREAN SECTION WITH BILATERAL TUBAL LIGATION Bilateral 2017    Family History  Problem Relation Age of Onset   Hypertension Father     No Known Allergies  Current Outpatient Medications on File Prior to Visit  Medication Sig Dispense Refill   amLODipine (NORVASC) 5 MG tablet Take 1 tablet (5 mg total) by mouth daily. 90 tablet 0   carvedilol (COREG) 12.5 MG tablet Take 1 tablet (12.5 mg total) by mouth 2  (two) times daily. 180 tablet 0   No current facility-administered medications on file prior to visit.    BP 105/68    Pulse (!) 57    Temp 98.1 F (36.7 C) (Oral)    Resp 16    Ht 4\' 10"  (1.473 m)    Wt 164 lb (74.4 kg)    SpO2 100%    BMI 34.28 kg/m    Objective:   Physical Exam  Physical Exam  Constitutional: She is oriented to person, place, and time. She appears well-developed and well-nourished. No distress.  HENT:  Head: Normocephalic and atraumatic.  Right Ear: Tympanic membrane and ear canal normal.  Left Ear: Tympanic membrane and ear canal normal.  Mouth/Throat: not examined- pt wearing mask Eyes: Pupils are equal, round, and reactive to light. No scleral icterus.  Neck: Normal range of motion. No thyromegaly present.  Cardiovascular: Normal rate and regular rhythm.   No murmur heard. Pulmonary/Chest: Effort normal and breath sounds normal. No respiratory distress. He has no wheezes. She has no rales. She exhibits no tenderness.  Abdominal: Soft. Bowel sounds are normal. She exhibits no distension and no mass. There is no tenderness. There is no rebound and no guarding.  Musculoskeletal: She exhibits no edema.  Lymphadenopathy:    She has no cervical adenopathy.  Neurological: She is alert and oriented to person, place, and time. She has normal patellar reflexes. She exhibits normal muscle tone. Coordination normal.  Skin: Skin is warm and dry.  Psychiatric: She has a normal mood and affect. Her behavior is normal. Judgment and thought content normal.  Breasts: Examined lying Right: Without masses, retractions, discharge or axillary adenopathy.  Left: Without masses, retractions, discharge or axillary adenopathy.  Pelvic: deferred          Assessment & Plan:    Preventative care- discussed healthy diet, exercise and weight loss.   Immunizations reviewed and up to date. She has her period today but will return for pap smear.  Labs as ordered.  She never  completed her recommended diagnostic mammogram last year- I have encouraged her to schedule this asap and have replaced orders.  Labs as ordered.   This visit occurred during the SARS-CoV-2 public health emergency.  Safety protocols were in place, including screening questions prior to the visit, additional usage of staff PPE, and extensive cleaning of exam room while observing appropriate contact time as indicated for disinfecting solutions.        Assessment & Plan:

## 2019-12-10 NOTE — Patient Instructions (Signed)
Please complete lab work prior to leaving. Continue to work on [employer], exercise and weight loss. [facility]- someone should be reaching out to you about this. Continue to work on [employer], regular exercise and weight loss.    Preventive Care 69-41 Years Old, Female Preventive care refers to visits with your health care provider and lifestyle choices that can promote health and wellness. This includes:  A yearly physical exam. This may also be called an annual well check.  Regular dental visits and eye exams.  Immunizations.  Screening for certain conditions.  Healthy lifestyle choices, such as eating a healthy diet, getting regular exercise, not using drugs or products that contain nicotine and tobacco, and limiting alcohol use. What can I expect for my preventive care visit? Physical exam Your health care provider will check your:  Height and weight. This may be used to calculate body mass index (BMI), which tells if you are at a healthy weight.  Heart rate and blood pressure.  Skin for abnormal spots. Counseling Your health care provider may ask you questions about your:  Alcohol, tobacco, and drug use.  Emotional well-being.  Home and relationship well-being.  Sexual activity.  Eating habits.  Work and work Statistician.  Method of birth control.  Menstrual cycle.  Pregnancy history. What immunizations do I need?  Influenza (flu) vaccine  This is recommended every year. Tetanus, diphtheria, and pertussis (Tdap) vaccine  You may need a Td booster every 10 years. Varicella (chickenpox) vaccine  You may need this if you have not been vaccinated. Human papillomavirus (HPV) vaccine  If recommended by your health care provider, you may need three doses over 6 months. Measles, mumps, and rubella (MMR) vaccine  You may need at least one dose of MMR. You may also need a second dose. Meningococcal  conjugate (MenACWY) vaccine  One dose is recommended if you are age 37-21 years and a first-year college student living in a residence hall, or if you have one of several medical conditions. You may also need additional booster doses. Pneumococcal conjugate (PCV13) vaccine  You may need this if you have certain conditions and were not previously vaccinated. Pneumococcal polysaccharide (PPSV23) vaccine  You may need one or two doses if you smoke cigarettes or if you have certain conditions. Hepatitis A vaccine  You may need this if you have certain conditions or if you travel or work in places where you may be exposed to hepatitis A. Hepatitis B vaccine  You may need this if you have certain conditions or if you travel or work in places where you may be exposed to hepatitis B. Haemophilus influenzae type b (Hib) vaccine  You may need this if you have certain conditions. You may receive vaccines as individual doses or as more than one vaccine together in one shot (combination vaccines). Talk with your health care provider about the risks and benefits of combination vaccines. What tests do I need?  Blood tests  Lipid and cholesterol levels. These may be checked every 5 years starting at age 63.  Hepatitis C test.  Hepatitis B test. Screening  Diabetes screening. This is done by checking your blood sugar (glucose) after you have not eaten for a while (fasting).  Sexually transmitted disease (STD) testing.  BRCA-related cancer screening. This may be done if you have a family history of breast, ovarian, tubal, or peritoneal cancers.  Pelvic exam and Pap test. This may be done every  3 years starting at age 36. Starting at age 20, this may be done every 5 years if you have a Pap test in combination with an HPV test. Talk with your health care provider about your test results, treatment options, and if necessary, the need for more tests. Follow these instructions at home: Eating and  drinking   Eat a diet that includes fresh fruits and vegetables, whole grains, lean protein, and low-fat dairy.  Take vitamin and mineral supplements as recommended by your health care provider.  Do not drink alcohol if: ? Your health care provider tells you not to drink. ? You are pregnant, may be pregnant, or are planning to become pregnant.  If you drink alcohol: ? Limit how much you have to 0-1 drink a day. ? Be aware of how much alcohol is in your drink. In the [place] one drink equals one 12 oz bottle of beer (355 mL), one 5 oz glass of wine (148 mL), or one 1 oz glass of hard liquor (44 mL). Lifestyle  Take daily care of your teeth and gums.  Stay active. Exercise for at least 30 minutes on 5 or more days each week.  Do not use any products that contain nicotine or tobacco, such as cigarettes, e-cigarettes, and chewing tobacco. If you need help quitting, ask your health care provider.  If you are sexually active, practice safe sex. Use a condom or other form of birth control (contraception) in order to prevent pregnancy and STIs (sexually transmitted infections). If you plan to become pregnant, see your health care provider for a preconception visit. What's next?  Visit your health care provider once a year for a well check visit.  Ask your health care provider how often you should have your eyes and teeth checked.  Stay up to date on all vaccines. This information is not intended to replace advice given to you by your health care provider. Make sure you discuss any questions you have with your health care provider. Document Revised: 10/05/2017 Document Reviewed: 10/05/2017 Elsevier Patient Education  2020 [employer].

## 2019-12-11 LAB — COMPREHENSIVE METABOLIC PANEL
AG Ratio: 1.4 (calc) (ref 1.0–2.5)
ALT: 29 U/L (ref 6–29)
AST: 25 U/L (ref 10–30)
Albumin: 4.3 g/dL (ref 3.6–5.1)
Alkaline phosphatase (APISO): 71 U/L (ref 31–125)
BUN: 16 mg/dL (ref 7–25)
CO2: 21 mmol/L (ref 20–32)
Calcium: 9.4 mg/dL (ref 8.6–10.2)
Chloride: 101 mmol/L (ref 98–110)
Creat: 0.72 mg/dL (ref 0.50–1.10)
Globulin: 3.1 g/dL (calc) (ref 1.9–3.7)
Glucose, Bld: 89 mg/dL (ref 65–99)
Potassium: 4 mmol/L (ref 3.5–5.3)
Sodium: 136 mmol/L (ref 135–146)
Total Bilirubin: 0.5 mg/dL (ref 0.2–1.2)
Total Protein: 7.4 g/dL (ref 6.1–8.1)

## 2019-12-11 LAB — TSH: TSH: 3.92 mIU/L

## 2019-12-19 MED FILL — PFIZER-BIONTECH COVID-19 VA: 30 | 1 days supply | Qty: 0 | Fill #0

## 2019-12-24 ENCOUNTER — Ambulatory Visit: Payer: No Typology Code available for payment source

## 2019-12-25 ENCOUNTER — Other Ambulatory Visit: Payer: Self-pay | Admitting: Family

## 2019-12-25 ENCOUNTER — Other Ambulatory Visit: Payer: Self-pay

## 2019-12-25 ENCOUNTER — Ambulatory Visit (INDEPENDENT_AMBULATORY_CARE_PROVIDER_SITE_OTHER): Payer: No Typology Code available for payment source | Admitting: Family

## 2019-12-25 VITALS — BP 115/77 | HR 69 | Temp 98.1°F | Resp 16 | Ht <= 58 in | Wt 161.0 lb

## 2019-12-25 DIAGNOSIS — N9489 Other specified conditions associated with female genital organs and menstrual cycle: Secondary | ICD-10-CM | POA: Diagnosis not present

## 2019-12-25 DIAGNOSIS — I10 Essential (primary) hypertension: Secondary | ICD-10-CM | POA: Diagnosis not present

## 2019-12-25 NOTE — Patient Instructions (Addendum)
  You should be contacted about scheduling your appointment with the GYN and your pelvic ultrasound. Please let me know if you are not contacted.

## 2019-12-25 NOTE — Progress Notes (Signed)
Subjective:    Patient ID: Bridget Rodriguez, female    DOB: 12-30-1980, 39 y.o.   MRN: [id number]  HPI  Patient is a 39 yr old female who presents today for pap smear. Pap was postponed due to menstrual cycle at time of her annual physical.   HTN- maintained on amlodipine 5mg  and carvedilol 12.5mg  bid. Continue amlodipine 5mg  and carvedilol 12.5mg  bid.  BP Readings from Last 3 Encounters:  12/25/19 115/77  12/10/19 105/68  08/30/19 112/68     Review of Systems See HPI  Past Medical History:  Diagnosis Date   Eczema 10/28/2016   Essential hypertension 10/28/2016   Fatty liver    Hypertension    Pelvic mass 2017   Benign pelvic floor mass per pt.     Social History   Socioeconomic History   Marital status: Married    Spouse name: Not on file   Number of children: Not on file   Years of education: Not on file   Highest education level: Not on file  Occupational History   Not on file  Tobacco Use   Smoking status: Never Smoker   Smokeless tobacco: Never Used  Vaping Use   Vaping Use: Never used  Substance and Sexual Activity   Alcohol use: No   Drug use: No   Sexual activity: Yes    Birth control/protection: Surgical  Other Topics Concern   Not on file  Social History Narrative   2 children    2017- daughter- 2018   2009- son- Grover Canavan   Married   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    Social Determinants of Health   Financial Resource Strain:    Difficulty of Paying Living Expenses: Not on file  Food Insecurity:    Worried About [employer] in the Last Year: Not on file   Barrister's clerk of Food in the Last Year: Not on file  Transportation Needs:    Lack of Transportation (Medical): Not on file   Lack of Transportation (Non-Medical): Not on file  Physical Activity:    Days of Exercise per Week: Not on file   Minutes of Exercise per Session: Not on file   Stress:    Feeling of Stress : Not on file  Social Connections:    Frequency of Communication with Friends and Family: Not on file   Frequency of Social Gatherings with Friends and Family: Not on file   Attends Religious Services: Not on file   Active Member of Clubs or Organizations: Not on file   Attends Programme researcher, broadcasting/film/video Meetings: Not on file   Marital Status: Not on file  Intimate Partner Violence:    Fear of Current or Ex-Partner: Not on file   Emotionally Abused: Not on file   Physically Abused: Not on file   Sexually Abused: Not on file    Past Surgical History:  Procedure Laterality Date   CESAREAN SECTION  2009   CESAREAN SECTION WITH BILATERAL TUBAL LIGATION Bilateral 2017    Family History  Problem Relation Age of Onset   Hypertension Father     No Known Allergies  Current Outpatient Medications on File Prior to Visit  Medication Sig Dispense Refill   amLODipine (NORVASC) 5 MG tablet Take 1 tablet (5 mg total) by mouth daily. 90 tablet 0   carvedilol (COREG) 12.5 MG tablet Take 1 tablet (12.5 mg total)  by mouth 2 (two) times daily. 180 tablet 0   No current facility-administered medications on file prior to visit.    BP 115/77 (BP Location: Right Arm, Patient Position: Sitting, Cuff Size: Small)    Pulse 69    Temp 98.1 F (36.7 C) (Temporal)    Resp 16    Ht 4\' 10"  (1.473 m)    Wt 161 lb (73 kg)    SpO2 99%    BMI 33.65 kg/m       Objective:   Physical Exam Exam conducted with a chaperone present.  Constitutional:      Appearance: Normal appearance.  Pulmonary:     Effort: Pulmonary effort is normal.  Genitourinary:    Labia:        Right: No rash or lesion.        Left: No rash or lesion.      Urethra: No prolapse or urethral swelling.     Vagina: Normal.     Uterus: Normal.      Adnexa: Left adnexa normal.       Right: Mass (large amount of fullness noted right lower adnexa.  Non-tender) present.   Neurological:     Mental  Status: She is alert.           Assessment & Plan:  Pelvic mass- pt states that this has been present for years and she had a remote abdominal ultrasound and was told that it appeared benign. I attempted a pap smear today, but was unable to visualize cervix due to exam limitations from the mass. Will obtain pelvic/transvaginal to further evaluate and refer to GYN.  This visit occurred during the SARS-CoV-2 public health emergency.  Safety protocols were in place, including screening questions prior to the visit, additional usage of staff PPE, and extensive cleaning of exam room while observing appropriate contact time as indicated for disinfecting solutions.     HTN- bp stable. Continue

## 2019-12-26 ENCOUNTER — Ambulatory Visit: Payer: No Typology Code available for payment source | Admitting: Family Medicine

## 2020-01-15 ENCOUNTER — Ambulatory Visit (HOSPITAL_BASED_OUTPATIENT_CLINIC_OR_DEPARTMENT_OTHER): Payer: No Typology Code available for payment source

## 2020-01-22 ENCOUNTER — Ambulatory Visit (HOSPITAL_BASED_OUTPATIENT_CLINIC_OR_DEPARTMENT_OTHER)
Admission: RE | Admit: 2020-01-22 | Discharge: 2020-01-22 | Disposition: A | Discharge status: Home / Self Care | Payer: No Typology Code available for payment source | Source: Ambulatory Visit | Attending: Family | Admitting: Family

## 2020-01-22 ENCOUNTER — Other Ambulatory Visit: Payer: Self-pay

## 2020-01-22 ENCOUNTER — Telehealth: Payer: Self-pay | Admitting: Family

## 2020-01-22 DIAGNOSIS — N9489 Other specified conditions associated with female genital organs and menstrual cycle: Secondary | ICD-10-CM | POA: Diagnosis not present

## 2020-01-22 NOTE — Telephone Encounter (Signed)
See mychart.  

## 2020-01-30 ENCOUNTER — Ambulatory Visit
Admission: RE | Admit: 2020-01-30 | Discharge: 2020-01-30 | Disposition: A | Discharge status: Home / Self Care | Payer: No Typology Code available for payment source | Source: Ambulatory Visit | Attending: Family | Admitting: Family

## 2020-01-30 ENCOUNTER — Other Ambulatory Visit: Payer: Self-pay

## 2020-01-30 ENCOUNTER — Ambulatory Visit: Payer: No Typology Code available for payment source

## 2020-01-30 DIAGNOSIS — R928 Other abnormal and inconclusive findings on diagnostic imaging of breast: Secondary | ICD-10-CM

## 2020-02-12 ENCOUNTER — Encounter: Payer: No Typology Code available for payment source | Admitting: Obstetrics & Gynecology

## 2020-02-17 ENCOUNTER — Encounter: Payer: Self-pay | Admitting: Family

## 2020-02-18 ENCOUNTER — Telehealth: Payer: No Typology Code available for payment source | Admitting: Family

## 2020-02-18 ENCOUNTER — Other Ambulatory Visit: Payer: Self-pay

## 2020-06-21 ENCOUNTER — Emergency Department (HOSPITAL_BASED_OUTPATIENT_CLINIC_OR_DEPARTMENT_OTHER)
Admission: EM | Admit: 2020-06-21 | Discharge: 2020-06-22 | Disposition: A | Discharge status: Home / Self Care | Payer: No Typology Code available for payment source | Attending: Emergency Medicine | Admitting: Emergency Medicine

## 2020-06-21 ENCOUNTER — Other Ambulatory Visit: Payer: Self-pay

## 2020-06-21 ENCOUNTER — Encounter (HOSPITAL_BASED_OUTPATIENT_CLINIC_OR_DEPARTMENT_OTHER): Payer: Self-pay

## 2020-06-21 DIAGNOSIS — R718 Other abnormality of red blood cells: Secondary | ICD-10-CM | POA: Diagnosis not present

## 2020-06-21 DIAGNOSIS — R42 Dizziness and giddiness: Secondary | ICD-10-CM | POA: Diagnosis not present

## 2020-06-21 DIAGNOSIS — Z79899 Other long term (current) drug therapy: Secondary | ICD-10-CM | POA: Diagnosis not present

## 2020-06-21 DIAGNOSIS — I10 Essential (primary) hypertension: Secondary | ICD-10-CM | POA: Diagnosis not present

## 2020-06-21 LAB — URINALYSIS, ROUTINE W REFLEX MICROSCOPIC
Bilirubin Urine: NEGATIVE
Glucose, UA: NEGATIVE mg/dL
Hgb urine dipstick: NEGATIVE
Ketones, ur: NEGATIVE mg/dL
Leukocytes,Ua: NEGATIVE
Nitrite: NEGATIVE
Protein, ur: NEGATIVE mg/dL
Specific Gravity, Urine: 1.01 (ref 1.005–1.030)
pH: 7.5 (ref 5.0–8.0)

## 2020-06-21 LAB — PREGNANCY, URINE: Preg Test, Ur: NEGATIVE

## 2020-06-21 LAB — BASIC METABOLIC PANEL
Anion gap: 11 (ref 5–15)
BUN: 13 mg/dL (ref 6–20)
CO2: 24 mmol/L (ref 22–32)
Calcium: 9.3 mg/dL (ref 8.9–10.3)
Chloride: 103 mmol/L (ref 98–111)
Creatinine, Ser: 0.6 mg/dL (ref 0.44–1.00)
GFR, Estimated: >60 mL/min (ref >60)
Glucose, Bld: 106 mg/dL — ABNORMAL HIGH (ref 70–99)
Potassium: 3.6 mmol/L (ref 3.5–5.1)
Sodium: 138 mmol/L (ref 135–145)

## 2020-06-21 NOTE — ED Provider Notes (Signed)
[facility] Provider Note: Lowella Dell, MD, FACEP  CSN: [id number] MRN: [id number] ARRIVAL: 06/21/20 at 2204 ROOM: [facility]09/[facility]09   CHIEF COMPLAINT  Dizziness   HISTORY OF PRESENT ILLNESS  06/21/20 11:08 PM Bridget Rodriguez is a 40 y.o. female with dizziness that began about an hour prior to arrival.  The dizziness is described as a sensation that she might pass out.  It is worse when she stands.  The symptoms have been intermittent since onset.  It was preceded by pain in the back of her neck which she describes as feeling like a headache in her neck.  The pain in her neck has subsequently improved.  When she feels dizzy she also has a sensation of tingling in her fingertips as well as a cold, wavelike sensation across her face.  She denies chest pain, shortness of breath, nausea, vomiting or urinary changes.   Past Medical History:  Diagnosis Date  . Eczema 10/28/2016  . Essential hypertension 10/28/2016  . Fatty liver   . Hypertension   . Pelvic mass 2017   Benign pelvic floor mass per pt.    Past Surgical History:  Procedure Laterality Date  . CESAREAN SECTION  2009  . CESAREAN SECTION WITH BILATERAL TUBAL LIGATION Bilateral 2017    Family History  Problem Relation Age of Onset  . Hypertension Father     Social History   Tobacco Use  . Smoking status: Never Smoker  . Smokeless tobacco: Never Used  Vaping Use  . Vaping Use: Never used  Substance Use Topics  . Alcohol use: No  . Drug use: No    Prior to Admission medications   Medication Sig Start Date End Date Taking? Authorizing Provider  amLODipine (NORVASC) 5 MG tablet Take 1 tablet (5 mg total) by mouth daily. 12/25/19   Sandford Craze, NP  carvedilol (COREG) 12.5 MG tablet Take 1 tablet (12.5 mg total) by mouth 2 (two) times daily. 12/25/19   Sandford Craze, NP  COVID-19 mRNA vaccine, Pfizer, 30 MCG/0.3ML injection INJECT AS DIRECTED 12/10/19 12/09/20  Judyann Munson, MD     Allergies Patient has no known allergies.   REVIEW OF SYSTEMS  Negative except as noted here or in the History of Present Illness.   PHYSICAL EXAMINATION  Initial Vital Signs Blood pressure (!) 134/95, pulse 86, temperature 98.6 F (37 C), temperature source Oral, resp. rate 18, height 4\' 10"  (1.473 m), weight 70 kg, last menstrual period 06/03/2020, SpO2 100 %.  Examination General: Well-developed, well-nourished female in no acute distress; appearance consistent with age of record HENT: normocephalic; atraumatic Eyes: pupils equal, round and reactive to light; extraocular muscles intact; no nystagmus; faint arcus senilis bilaterally Neck: supple; nontender; no bruit Heart: regular rate and rhythm Lungs: clear to auscultation bilaterally Abdomen: soft; nondistended; nontender; bowel sounds present Extremities: No deformity; full range of motion; pulses normal Neurologic: Awake, alert and oriented; motor function intact in all extremities and symmetric; no facial droop; normal coordination and speech Skin: Warm and dry Psychiatric: Normal mood and affect   RESULTS  Summary of this visit's results, reviewed and interpreted by myself:   EKG Interpretation  Date/Time:    Ventricular Rate:    PR Interval:    QRS Duration:   QT Interval:    QTC Calculation:   R Axis:     Text Interpretation:        Laboratory Studies: Results for orders placed or performed during the hospital encounter of 06/21/20 (from the  past 24 hour(s))  CBC with Differential/Platelet     Status: Abnormal   Collection Time: 06/21/20 11:34 PM  Result Value Ref Range   WBC 7.2 4.0 - 10.5 K/uL   RBC 6.64 (H) 3.87 - 5.11 MIL/uL   Hemoglobin 13.2 12.0 - 15.0 g/dL   HCT 51.7 00.1 - 74.9 %   MCV 65.1 (L) 80.0 - 100.0 fL   MCH 19.9 (L) 26.0 - 34.0 pg   MCHC 30.6 30.0 - 36.0 g/dL   RDW 44.9 (H) 67.5 - 91.6 %   Platelets 313 150 - 400 K/uL   nRBC 0.0 0.0 - 0.2 %   Neutrophils Relative % 75 %    Neutro Abs 5.3 1.7 - 7.7 K/uL   Lymphocytes Relative 20 %   Lymphs Abs 1.4 0.7 - 4.0 K/uL   Monocytes Relative 4 %   Monocytes Absolute 0.3 0.1 - 1.0 K/uL   Eosinophils Relative 1 %   Eosinophils Absolute 0.1 0.0 - 0.5 K/uL   Basophils Relative 0 %   Basophils Absolute 0.0 0.0 - 0.1 K/uL   WBC Morphology MORPHOLOGY UNREMARKABLE    RBC Morphology MORPHOLOGY UNREMARKABLE    Smear Review MORPHOLOGY UNREMARKABLE    Immature Granulocytes 0 %   Abs Immature Granulocytes 0.03 0.00 - 0.07 K/uL  Basic metabolic panel     Status: Abnormal   Collection Time: 06/21/20 11:34 PM  Result Value Ref Range   Sodium 138 135 - 145 mmol/L   Potassium 3.6 3.5 - 5.1 mmol/L   Chloride 103 98 - 111 mmol/L   CO2 24 22 - 32 mmol/L   Glucose, Bld 106 (H) 70 - 99 mg/dL   BUN 13 6 - 20 mg/dL   Creatinine, Ser 3.84 0.44 - 1.00 mg/dL   Calcium 9.3 8.9 - 66.5 mg/dL   GFR, Estimated >99 >35 mL/min   Anion gap 11 5 - 15  Urinalysis, Routine w reflex microscopic     Status: None   Collection Time: 06/21/20 11:34 PM  Result Value Ref Range   Color, Urine YELLOW YELLOW   APPearance CLEAR CLEAR   Specific Gravity, Urine 1.010 1.005 - 1.030   pH 7.5 5.0 - 8.0   Glucose, UA NEGATIVE NEGATIVE mg/dL   Hgb urine dipstick NEGATIVE NEGATIVE   Bilirubin Urine NEGATIVE NEGATIVE   Ketones, ur NEGATIVE NEGATIVE mg/dL   Protein, ur NEGATIVE NEGATIVE mg/dL   Nitrite NEGATIVE NEGATIVE   Leukocytes,Ua NEGATIVE NEGATIVE  Pregnancy, urine     Status: None   Collection Time: 06/21/20 11:34 PM  Result Value Ref Range   Preg Test, Ur NEGATIVE NEGATIVE   Imaging Studies: No results found.  ED COURSE and MDM  Nursing notes, initial and subsequent vitals signs, including pulse oximetry, reviewed and interpreted by myself.  Vitals:   06/21/20 2337 06/21/20 2353 06/22/20 0000 06/22/20 0030  BP: 114/84 (!) 124/96 122/89 (!) 121/95  Pulse: 64 76 74 66  Resp: 15 15 17 20   Temp:      TempSrc:      SpO2: 100% 97% 99% 98%   Weight:      Height:       Medications - No data to display  1:11 AM The patient's symptoms have abated without intervention.  The cause of the symptoms is unclear at this time.  She had no neurologic deficits on exam to suggest a stroke.  Her symptoms were more lightheadedness and not vertiginous in nature.  This could have been an atypical  migraine.  She was advised of her microcytosis without anemia and will follow up with her PCP regarding this.  PROCEDURES  Procedures   ED DIAGNOSES     ICD-10-CM   1. Lightheadedness  R42   2. RBC microcytosis  R71.8      Kirt Chew, MD 06/22/20 [phone]

## 2020-06-21 NOTE — ED Notes (Signed)
Pt sts she feels lightheaded when she stands; thinks she might be dehydrated.

## 2020-06-21 NOTE — ED Notes (Signed)
EDP at bedside  

## 2020-06-21 NOTE — ED Triage Notes (Addendum)
Pt arrived via POV. States she started feeling dizzy approx 1 hour ago. States she feels like she may pass out when the dizziness comes on. Denies injury, N/V, fever. Reports history of HTN.

## 2020-06-22 ENCOUNTER — Telehealth: Payer: Self-pay | Admitting: Family

## 2020-06-22 LAB — CBC WITH DIFFERENTIAL/PLATELET
Abs Immature Granulocytes: 0.03 10*3/uL (ref 0.00–0.07)
Basophils Absolute: 0 10*3/uL (ref 0.0–0.1)
Basophils Relative: 0 %
Eosinophils Absolute: 0.1 10*3/uL (ref 0.0–0.5)
Eosinophils Relative: 1 %
HCT: 43.2 % (ref 36.0–46.0)
Hemoglobin: 13.2 g/dL (ref 12.0–15.0)
Immature Granulocytes: 0 %
Lymphocytes Relative: 20 %
Lymphs Abs: 1.4 10*3/uL (ref 0.7–4.0)
MCH: 19.9 pg — ABNORMAL LOW (ref 26.0–34.0)
MCHC: 30.6 g/dL (ref 30.0–36.0)
MCV: 65.1 fL — ABNORMAL LOW (ref 80.0–100.0)
Monocytes Absolute: 0.3 10*3/uL (ref 0.1–1.0)
Monocytes Relative: 4 %
Neutro Abs: 5.3 10*3/uL (ref 1.7–7.7)
Neutrophils Relative %: 75 %
Platelets: 313 10*3/uL (ref 150–400)
RBC: 6.64 MIL/uL — ABNORMAL HIGH (ref 3.87–5.11)
RDW: 16.7 % — ABNORMAL HIGH (ref 11.5–15.5)
WBC: 7.2 10*3/uL (ref 4.0–10.5)
nRBC: 0 % (ref 0.0–0.2)

## 2020-06-22 NOTE — ED Notes (Signed)
Pt ambulated with stand by to bathroom; pt reports dizziness had greatly improved; gait steady, tolerated well; EDP aware

## 2020-06-22 NOTE — Telephone Encounter (Signed)
See mychart.  

## 2020-06-26 ENCOUNTER — Ambulatory Visit (INDEPENDENT_AMBULATORY_CARE_PROVIDER_SITE_OTHER): Payer: No Typology Code available for payment source | Admitting: Family

## 2020-06-26 ENCOUNTER — Encounter: Payer: Self-pay | Admitting: Family

## 2020-06-26 ENCOUNTER — Other Ambulatory Visit: Payer: Self-pay

## 2020-06-26 VITALS — BP 131/81 | HR 77 | Temp 98.6°F | Resp 16 | Ht <= 58 in | Wt 165.0 lb

## 2020-06-26 DIAGNOSIS — R718 Other abnormality of red blood cells: Secondary | ICD-10-CM

## 2020-06-26 DIAGNOSIS — I10 Essential (primary) hypertension: Secondary | ICD-10-CM | POA: Diagnosis not present

## 2020-06-26 DIAGNOSIS — R5383 Other fatigue: Secondary | ICD-10-CM

## 2020-06-26 HISTORY — DX: Other abnormality of red blood cells: R71.8

## 2020-06-26 HISTORY — DX: Other fatigue: R53.83

## 2020-06-26 NOTE — Patient Instructions (Signed)
Please complete lab work prior to leaving. Please reschedule your gyn appointment.

## 2020-06-26 NOTE — Assessment & Plan Note (Signed)
Improving. Will cbc.  Advised her to continue to work on hydration and let me know if her symptoms do not continue to improve.

## 2020-06-26 NOTE — Assessment & Plan Note (Signed)
BP stable. Continue amlodipine 5mg  and carvedilol 12.5mg  bid.

## 2020-06-26 NOTE — Assessment & Plan Note (Signed)
Check iron studies and hemoglobin electrophoresis.

## 2020-06-26 NOTE — Progress Notes (Signed)
Subjective:   By signing my name below, I, Shehryar Baig, attest that this documentation has been prepared under the direction and in the presence of Sandford Craze NP. 06/26/2020      Patient ID: Bridget Rodriguez, female    DOB: 1980-12-03, 40 y.o.   MRN: [id number]  No chief complaint on file.   HPI   Patient is in today complaining of dizziness. She went to the ER on 06/21/2020 after feeling lightheadedness. ED work up included CBC (microcytosi), BMET and UA which were both unremarkable. She has rested and not worked since then and felt some relief. She takes iron supplements to manage her symptoms but finds relief last few hours before symptoms return. She reports having elevated stress for the past couple of months from working too much.    Gynecologist- She is going to schedule and upcoming gynecologist appointment to manage her pelvic mass. She has had to reschedule multiple times due to her unpredictable menstrual cycle.    Past Medical History:  Diagnosis Date  . Eczema 10/28/2016  . Essential hypertension 10/28/2016  . Fatty liver   . Hypertension   . Pelvic mass 2017   Benign pelvic floor mass per pt.    Past Surgical History:  Procedure Laterality Date  . CESAREAN SECTION  2009  . CESAREAN SECTION WITH BILATERAL TUBAL LIGATION Bilateral 2017    Family History  Problem Relation Age of Onset  . Hypertension Father     Social History   Socioeconomic History  . Marital status: Married    Spouse name: Not on file  . Number of children: Not on file  . Years of education: Not on file  . Highest education level: Not on file  Occupational History  . Not on file  Tobacco Use  . Smoking status: Never Smoker  . Smokeless tobacco: Never Used  Vaping Use  . Vaping Use: Never used  Substance and Sexual Activity  . Alcohol use: No  . Drug use: No  . Sexual activity: Yes    Birth control/protection: Surgical  Other Topics Concern  . 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    Social Determinants of Health   Financial Resource Strain: Not on file  Food Insecurity: Not on file  Transportation Needs: Not on file  Physical Activity: Not on file  Stress: Not on file  Social Connections: Not on file  Intimate Partner Violence: Not on file    Outpatient Medications Prior to Visit  Medication Sig Dispense Refill  . amLODipine (NORVASC) 5 MG tablet Take 1 tablet (5 mg total) by mouth daily. 90 tablet 1  . carvedilol (COREG) 12.5 MG tablet Take 1 tablet (12.5 mg total) by mouth 2 (two) times daily. 180 tablet 1  . COVID-19 mRNA vaccine, Pfizer, 30 MCG/0.3ML injection INJECT AS DIRECTED .3 mL 0   No facility-administered medications prior to visit.    No Known Allergies  Review of Systems  Neurological: Positive for dizziness.       Objective:    Physical Exam Constitutional:      Appearance: Normal appearance.  HENT:     Head: Normocephalic and atraumatic.     Right Ear: Tympanic membrane and external ear normal.  Left Ear: Tympanic membrane and external ear normal.  Eyes:     Extraocular Movements: Extraocular movements intact.     Pupils: Pupils are equal, round, and reactive to light.  Cardiovascular:     Rate and Rhythm: Normal rate and regular rhythm.     Pulses: Normal pulses.     Heart sounds: Normal heart sounds. No murmur heard. No gallop.   Pulmonary:     Effort: Pulmonary effort is normal. No respiratory distress.     Breath sounds: Normal breath sounds. No wheezing, rhonchi or rales.  Skin:    General: Skin is warm and dry.  Neurological:     Mental Status: She is alert and oriented to person, place, and time.  Psychiatric:        Behavior: Behavior normal.     LMP 06/03/2020 (Exact Date)  Wt Readings from Last 3  Encounters:  06/21/20 154 lb 5.2 oz (70 kg)  12/25/19 161 lb (73 kg)  12/10/19 164 lb (74.4 kg)    Diabetic Foot Exam - Simple   No data filed    Lab Results  Component Value Date   WBC 7.2 06/21/2020   HGB 13.2 06/21/2020   HCT 43.2 06/21/2020   PLT 313 06/21/2020   GLUCOSE 106 (H) 06/21/2020   CHOL 187 11/13/2018   TRIG 118.0 11/13/2018   HDL 60.50 11/13/2018   LDLCALC 103 (H) 11/13/2018   ALT 29 12/10/2019   AST 25 12/10/2019   NA 138 06/21/2020   K 3.6 06/21/2020   CL 103 06/21/2020   CREATININE 0.60 06/21/2020   BUN 13 06/21/2020   CO2 24 06/21/2020   TSH 3.92 12/10/2019   HGBA1C 5.6 05/25/2017    Lab Results  Component Value Date   TSH 3.92 12/10/2019   Lab Results  Component Value Date   WBC 7.2 06/21/2020   HGB 13.2 06/21/2020   HCT 43.2 06/21/2020   MCV 65.1 (L) 06/21/2020   PLT 313 06/21/2020   Lab Results  Component Value Date   NA 138 06/21/2020   K 3.6 06/21/2020   CO2 24 06/21/2020   GLUCOSE 106 (H) 06/21/2020   BUN 13 06/21/2020   CREATININE 0.60 06/21/2020   BILITOT 0.5 12/10/2019   ALKPHOS 94 11/13/2018   AST 25 12/10/2019   ALT 29 12/10/2019   PROT 7.4 12/10/2019   ALBUMIN 4.4 11/13/2018   CALCIUM 9.3 06/21/2020   ANIONGAP 11 06/21/2020   GFR 100.71 11/13/2018   Lab Results  Component Value Date   CHOL 187 11/13/2018   Lab Results  Component Value Date   HDL 60.50 11/13/2018   Lab Results  Component Value Date   LDLCALC 103 (H) 11/13/2018   Lab Results  Component Value Date   TRIG 118.0 11/13/2018   Lab Results  Component Value Date   CHOLHDL 3 11/13/2018   Lab Results  Component Value Date   HGBA1C 5.6 05/25/2017       Assessment & Plan:   Problem List Items Addressed This Visit   None      No orders of the defined types were placed in this encounter.   I, Sandford Craze NP, personally preformed the services described in this documentation.  All medical record entries made by the scribe were at  my direction and in my presence.  I have reviewed the chart and discharge instructions (if applicable) and agree that the record reflects my personal performance and is accurate and complete. 06/26/2020   I,Shehryar  Baig,acting as a Neurosurgeon for [employer], NP.,have documented all relevant documentation on the behalf of Lemont Fillers, NP,as directed by  Lemont Fillers, NP while in the presence of Lemont Fillers, NP.   Shehryar [employer]

## 2020-06-27 LAB — IRON: Iron: 71 ug/dL (ref 40–190)

## 2020-06-27 LAB — FERRITIN: Ferritin: 40 ng/mL (ref 16–232)

## 2020-06-27 LAB — TSH: TSH: 3.49 m[IU]/L

## 2020-06-29 LAB — HGB FRACTIONATION CASCADE
Hgb A2: 2.5 % (ref 1.8–3.2)
Hgb A: 97.5 % (ref 96.4–98.8)
Hgb F: 0 % (ref 0.0–2.0)
Hgb S: 0 %

## 2020-07-01 ENCOUNTER — Telehealth: Payer: Self-pay | Admitting: Family

## 2020-07-01 DIAGNOSIS — R718 Other abnormality of red blood cells: Secondary | ICD-10-CM

## 2020-07-01 NOTE — Telephone Encounter (Signed)
Please advise pt that her hemoglobin electrophoresis is normal.  Most likely cause for her small blood cells is an inherited genetic trait called- thalassemia trait. There is a blood test I can order (pended) to confirm if she would like. There is no specific treatment for Thalassemia trait and it is not dangerous.

## 2020-07-02 NOTE — Telephone Encounter (Signed)
Called patient a few times but no answer, lvm for her to call back.

## 2020-07-08 NOTE — Telephone Encounter (Signed)
Patient has not return call, MyChart message sent to her with this information.

## 2020-08-12 ENCOUNTER — Other Ambulatory Visit: Payer: Self-pay | Admitting: Family

## 2020-09-30 ENCOUNTER — Ambulatory Visit (INDEPENDENT_AMBULATORY_CARE_PROVIDER_SITE_OTHER): Payer: No Typology Code available for payment source | Admitting: Family Medicine

## 2020-09-30 ENCOUNTER — Encounter: Payer: Self-pay | Admitting: Family Medicine

## 2020-09-30 ENCOUNTER — Other Ambulatory Visit: Payer: Self-pay

## 2020-09-30 ENCOUNTER — Other Ambulatory Visit (HOSPITAL_COMMUNITY)
Admission: RE | Admit: 2020-09-30 | Discharge: 2020-09-30 | Disposition: A | Discharge status: Home / Self Care | Payer: No Typology Code available for payment source | Source: Ambulatory Visit | Attending: Family Medicine | Admitting: Family Medicine

## 2020-09-30 VITALS — BP 124/68 | HR 65 | Ht <= 58 in | Wt 169.0 lb

## 2020-09-30 DIAGNOSIS — Z124 Encounter for screening for malignant neoplasm of cervix: Secondary | ICD-10-CM

## 2020-09-30 DIAGNOSIS — Z01419 Encounter for gynecological examination (general) (routine) without abnormal findings: Secondary | ICD-10-CM

## 2020-09-30 DIAGNOSIS — R1900 Intra-abdominal and pelvic swelling, mass and lump, unspecified site: Secondary | ICD-10-CM

## 2020-09-30 HISTORY — DX: Intra-abdominal and pelvic swelling, mass and lump, unspecified site: R19.00

## 2020-09-30 NOTE — Progress Notes (Signed)
Subjective:     Bridget Rodriguez is a 40 y.o. female and is here for a comprehensive physical exam. The patient reports problems - pelvic mass.,  Has h/o 10 cm pelvic mass which has been there and the same size for a while. She reports they have done a laparoscopy to remove and could not get to it. On u/s it appears is might me an endometrioma. It does not bother her. Her PCP sent her over for pap, as they could not easily see her cervix. Has h/o C-section x 2 and BTL. Cycles are regular and normal for her.   The following portions of the patient's history were reviewed and updated as appropriate: allergies, current medications, past family history, past medical history, past social history, past surgical history, and problem list.  Review of Systems Pertinent items are noted in HPI.   Objective:    BP 124/68   Pulse 65   Ht 4\' 10"  (1.473 m)   Wt 169 lb (76.7 kg)   LMP 09/13/2020   BMI 35.32 kg/m  General appearance: alert, cooperative, and appears stated age Head: Normocephalic, without obvious abnormality, atraumatic Neck: no adenopathy, supple, symmetrical, trachea midline, and thyroid not enlarged, symmetric, no tenderness/mass/nodules Lungs: clear to auscultation bilaterally Breasts: normal appearance, no masses or tenderness Heart: regular rate and rhythm, S1, S2 normal, no murmur, click, rub or gallop Abdomen: soft, non-tender; bowel sounds normal; no masses,  no organomegaly Pelvic: cervix normal in appearance, external genitalia normal, no adnexal masses or tenderness, no cervical motion tenderness, uterus normal size, shape, and consistency, and large mass filling the right pelvis and vagina, firm Extremities: extremities normal, atraumatic, no cyanosis or edema Pulses: 2+ and symmetric Skin: Skin color, texture, turgor normal. No rashes or lesions Lymph nodes: Cervical, supraclavicular, and axillary nodes normal. Neurologic: Grossly normal   Pelvic sonogram Mass in lower  pelvis adjacent to cervix, 9.1 x 10.1 x 8.3 cm, demonstrating fairly homogeneous isoechogenicity throughout. No internal blood flow on color Doppler imaging. Appearance most suggestive of an endometrioma though differential diagnosis includes less likely hemorrhagic cyst and dermoid tumor. Assessment:    Healthy female exam.      Plan:   Problem List Items Addressed This Visit       Unprioritized   Pelvic mass in female    Large and could not be reached by laparoscopic approach--may perhaps be opened and drained vaginally. Will check MRI and return with surgeon.      Relevant Orders   MR PELVIS W CONTRAST   Other Visit Diagnoses     Encounter for gynecological examination without abnormal finding    -  Primary   Relevant Orders   MM DIGITAL SCREENING BILATERAL   Screening for malignant neoplasm of cervix       Relevant Orders   Cytology - PAP( [place])      Return in 6 weeks (on 11/11/2020) for needs MD [facility] for possible surgery, in person.    See After Visit Summary for Counseling Recommendations

## 2020-09-30 NOTE — Patient Instructions (Signed)
Preventive Care 21-42 Years Old, Female Preventive care refers to lifestyle choices and visits with your health care provider that can promote health and wellness. This includes: A yearly physical exam. This is also called an annual wellness visit. Regular dental and eye exams. Immunizations. Screening for certain conditions. Healthy lifestyle choices, such as: Eating a healthy diet. Getting regular exercise. Not using drugs or products that contain nicotine and tobacco. Limiting alcohol use. What can I expect for my preventive care visit? Physical exam Your health care provider may check your: Height and weight. These may be used to calculate your BMI (body mass index). BMI is a measurement that tells if you are at a healthy weight. Heart rate and blood pressure. Body temperature. Skin for abnormal spots. Counseling Your health care provider may ask you questions about your: Past medical problems. Family's medical history. Alcohol, tobacco, and drug use. Emotional well-being. Home life and relationship well-being. Sexual activity. Diet, exercise, and sleep habits. Work and work environment. Access to firearms. Method of birth control. Menstrual cycle. Pregnancy history. What immunizations do I need?  Vaccines are usually given at various ages, according to a schedule. Your health care provider will recommend vaccines for you based on your age, medicalhistory, and lifestyle or other factors, such as travel or where you work. What tests do I need?  Blood tests Lipid and cholesterol levels. These may be checked every 5 years starting at age 20. Hepatitis C test. Hepatitis B test. Screening Diabetes screening. This is done by checking your blood sugar (glucose) after you have not eaten for a while (fasting). STD (sexually transmitted disease) testing, if you are at risk. BRCA-related cancer screening. This may be done if you have a family history of breast, ovarian, tubal, or  peritoneal cancers. Pelvic exam and Pap test. This may be done every 3 years starting at age 21. Starting at age 30, this may be done every 5 years if you have a Pap test in combination with an HPV test. Talk with your health care provider about your test results, treatment options,and if necessary, the need for more tests. Follow these instructions at home: Eating and drinking  Eat a healthy diet that includes fresh fruits and vegetables, whole grains, lean protein, and low-fat dairy products. Take vitamin and mineral supplements as recommended by your health care provider. Do not drink alcohol if: Your health care provider tells you not to drink. You are pregnant, may be pregnant, or are planning to become pregnant. If you drink alcohol: Limit how much you have to 0-1 drink a day. Be aware of how much alcohol is in your drink. In the [place] one drink equals one 12 oz bottle of beer (355 mL), one 5 oz glass of wine (148 mL), or one 1 oz glass of hard liquor (44 mL).  Lifestyle Take daily care of your teeth and gums. Brush your teeth every morning and night with fluoride toothpaste. Floss one time each day. Stay active. Exercise for at least 30 minutes 5 or more days each week. Do not use any products that contain nicotine or tobacco, such as cigarettes, e-cigarettes, and chewing tobacco. If you need help quitting, ask your health care provider. Do not use drugs. If you are sexually active, practice safe sex. Use a condom or other form of protection to prevent STIs (sexually transmitted infections). If you do not wish to become pregnant, use a form of birth control. If you plan to become pregnant, see your health care   provider for a prepregnancy visit. Find healthy ways to cope with stress, such as: Meditation, yoga, or listening to music. Journaling. Talking to a trusted person. Spending time with friends and family. Safety Always wear your seat belt while driving or riding in a  vehicle. Do not drive: If you have been drinking alcohol. Do not ride with someone who has been drinking. When you are tired or distracted. While texting. Wear a helmet and other protective equipment during sports activities. If you have firearms in your house, make sure you follow all gun safety procedures. Seek help if you have been physically or sexually abused. What's next? Go to your health care provider once a year for an annual wellness visit. Ask your health care provider how often you should have your eyes and teeth checked. Stay up to date on all vaccines. This information is not intended to replace advice given to you by your health care provider. Make sure you discuss any questions you have with your healthcare provider. Document Revised: 09/22/2019 Document Reviewed: 10/05/2017 Elsevier Patient Education  2022 [employer].

## 2020-10-02 ENCOUNTER — Encounter: Payer: Self-pay | Admitting: Family Medicine

## 2020-10-02 LAB — CYTOLOGY - PAP
Adequacy: ABSENT
Comment: NEGATIVE
Diagnosis: NEGATIVE
High risk HPV: NEGATIVE

## 2020-10-02 NOTE — Assessment & Plan Note (Signed)
Large and could not be reached by laparoscopic approach--may perhaps be opened and drained vaginally. Will check MRI and return with surgeon.

## 2020-10-22 ENCOUNTER — Telehealth: Payer: Self-pay

## 2020-10-22 NOTE — Telephone Encounter (Signed)
Bridget Rodriguez from [facility] called to get a verbal order to add contrast to her pelvic ct scan.  [name] l Jahziel Sinn, CMA

## 2020-10-24 ENCOUNTER — Other Ambulatory Visit: Payer: Self-pay

## 2020-10-24 ENCOUNTER — Ambulatory Visit (HOSPITAL_BASED_OUTPATIENT_CLINIC_OR_DEPARTMENT_OTHER)
Admission: RE | Admit: 2020-10-24 | Discharge: 2020-10-24 | Disposition: A | Discharge status: Home / Self Care | Payer: No Typology Code available for payment source | Source: Ambulatory Visit | Attending: Family Medicine | Admitting: Family Medicine

## 2020-10-24 DIAGNOSIS — R1900 Intra-abdominal and pelvic swelling, mass and lump, unspecified site: Secondary | ICD-10-CM | POA: Insufficient documentation

## 2020-10-24 MED ORDER — GADOBUTROL 1 MMOL/ML IV SOLN
7.5000 mL | Freq: Once | INTRAVENOUS | Status: AC | PRN
  Administered 2020-10-24: 7.5 mL via INTRAVENOUS

## 2020-11-23 ENCOUNTER — Other Ambulatory Visit: Payer: Self-pay | Admitting: Family

## 2020-11-24 ENCOUNTER — Other Ambulatory Visit (HOSPITAL_BASED_OUTPATIENT_CLINIC_OR_DEPARTMENT_OTHER): Payer: Self-pay | Admitting: Family

## 2020-11-24 DIAGNOSIS — Z1231 Encounter for screening mammogram for malignant neoplasm of breast: Secondary | ICD-10-CM

## 2020-12-23 ENCOUNTER — Encounter: Payer: Self-pay | Admitting: Family

## 2020-12-23 ENCOUNTER — Other Ambulatory Visit: Payer: Self-pay

## 2020-12-23 ENCOUNTER — Ambulatory Visit (INDEPENDENT_AMBULATORY_CARE_PROVIDER_SITE_OTHER): Payer: No Typology Code available for payment source | Admitting: Family

## 2020-12-23 VITALS — BP 130/91 | HR 67 | Temp 98.5°F | Resp 16 | Ht <= 58 in | Wt 167.0 lb

## 2020-12-23 DIAGNOSIS — R1900 Intra-abdominal and pelvic swelling, mass and lump, unspecified site: Secondary | ICD-10-CM

## 2020-12-23 DIAGNOSIS — Z0000 Encounter for general adult medical examination without abnormal findings: Secondary | ICD-10-CM

## 2020-12-23 DIAGNOSIS — I10 Essential (primary) hypertension: Secondary | ICD-10-CM | POA: Diagnosis not present

## 2020-12-23 DIAGNOSIS — R739 Hyperglycemia, unspecified: Secondary | ICD-10-CM

## 2020-12-23 HISTORY — DX: Encounter for general adult medical examination without abnormal findings: Z00.00

## 2020-12-23 LAB — COMPREHENSIVE METABOLIC PANEL
ALT: 31 U/L (ref 0–35)
AST: 22 U/L (ref 0–37)
Albumin: 4.4 g/dL (ref 3.5–5.2)
Alkaline Phosphatase: 81 U/L (ref 39–117)
BUN: 14 mg/dL (ref 6–23)
CO2: 25 mEq/L (ref 19–32)
Calcium: 8.9 mg/dL (ref 8.4–10.5)
Chloride: 103 mEq/L (ref 96–112)
Creatinine, Ser: 0.71 mg/dL (ref 0.40–1.20)
GFR: 104.75 mL/min (ref >60.00)
Glucose, Bld: 88 mg/dL (ref 70–99)
Potassium: 3.9 mEq/L (ref 3.5–5.1)
Sodium: 139 mEq/L (ref 135–145)
Total Bilirubin: 0.6 mg/dL (ref 0.2–1.2)
Total Protein: 7.5 g/dL (ref 6.0–8.3)

## 2020-12-23 LAB — HEMOGLOBIN A1C: Hgb A1c MFr Bld: 5.8 % (ref 4.6–6.5)

## 2020-12-23 NOTE — Assessment & Plan Note (Signed)
BP is elevated today. Advised pt to check bp once daily at home for 3 days and then send me her readings via mychart. Continue amlodipine 5mg  and coreg 12.5mg  once daily.

## 2020-12-23 NOTE — Patient Instructions (Signed)
Please continue your work on healthy diet, exercise and weight loss.  Follow up with GYN as scheduled re: your pelvic mass. Check blood pressure once daily for the next 3 days at home and send me your readings via [email or web address]

## 2020-12-23 NOTE — Assessment & Plan Note (Signed)
Wt Readings from Last 3 Encounters:  12/23/20 167 lb (75.8 kg)  09/30/20 169 lb (76.7 kg)  06/26/20 165 lb (74.8 kg)   Discussed healthy diet, exercise, weight loss.  BP Readings from Last 3 Encounters:  12/23/20 (!) 130/91  09/30/20 124/68  06/26/20 131/81

## 2020-12-23 NOTE — Progress Notes (Signed)
Subjective:   By signing my name below, I, Shehryar Baig, attest that this documentation has been prepared under the direction and in the presence of Debbrah Alar NP. 12/23/2020    Patient ID: Bridget Rodriguez, female    DOB: Oct 14, 1980, 40 y.o.   MRN: [id number]  Chief Complaint  Patient presents with   Annual Exam         HPI Patient is in today for a comprehensive physical exam.   GYN- She continues seeing a GYN specialist regularly. She has an upcomming appointment to discuss surgery on a pelvic mass. She has no pain but feels fullness from it.  Blood sugar- Her blood sugars were elevated during her last lab work. She is interested in checking them again during this lab visit.   Lab Results  Component Value Date   HGBA1C 5.6 05/25/2017   Blood pressure- Her blood pressure is slightly elevated. She continues taking 5 mg amlodipine daily PO, 12.5 mg carvedilol daily PO and reports no new issues while taking them. She typically checks her blood pressure at home but has not recently.   BP Readings from Last 3 Encounters:  12/23/20 (!) 130/91  09/30/20 124/68  06/26/20 131/81   Pulse Readings from Last 3 Encounters:  12/23/20 67  09/30/20 65  06/26/20 77   CPE She denies having any unexpected weight change, ear pain, hearing loss and rhinorrhea, visual disturbance, cough, chest pain and leg swelling, nausea, vomiting, diarrhea, constipation, blood in stool, or dysuria and frequency, for myalgias and arthralgias, rash, headaches, adenopathy, depression or anxiety at this time. Social history- She has no recent surgical procedures. She has no recent changes to her family medical history. She does not drink alcohol. She does not use drugs. She does not use tobacco or vaping products. She has tubal litigation for birth control.  Immunizations: She has 3 Covid-19 vaccines. She was informed of the bivalent Covid-19 vaccine. She is UTD on tetanus vaccines. She is UTD on flu  vaccines.  Diet: She is trying to maintain a healthy diet. She reports being on and off gaining weight.  Exercise: She participates in regular exercise by stretching for at least 15 minutes and walking.  Pap Smear: Last completed 09/30/2020. Results are normal. Repeat in 3 years.  Mammogram: Last completed 11/19/2018. Results showed 2 asymmetries in left breast.  Dental: She is UTD on dental. Vision: She is UTD on vision.   Health Maintenance Due  Topic Date Due   COVID-19 Vaccine (4 - Booster for Pfizer series) 02/04/2020    Past Medical History:  Diagnosis Date   Eczema 10/28/2016   Essential hypertension 10/28/2016   Fatty liver    Hypertension    Pelvic mass 2017   Benign pelvic floor mass per pt.    Past Surgical History:  Procedure Laterality Date   CESAREAN SECTION  2009   CESAREAN SECTION WITH BILATERAL TUBAL LIGATION Bilateral 2017   DIAGNOSTIC LAPAROSCOPY      Family History  Problem Relation Age of Onset   Hypertension Father     Social History   Socioeconomic History   Marital status: Married    Spouse name: Not on file   Number of children: Not on file   Years of education: Not on file   Highest education level: Not on file  Occupational History   Not on file  Tobacco Use   Smoking status: Never   Smokeless tobacco: Never  Vaping Use   Vaping Use: Never  used  Substance and Sexual Activity   Alcohol use: No   Drug use: No   Sexual activity: Yes    Birth control/protection: Surgical  Other Topics Concern   Not on file  Social History Narrative   2 children    2017- daughter- Albina Billet   2009- son- Lennette Bihari   Married   Works as an Therapist, 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    Social Determinants of Health   Financial Resource Strain: Not on file  Food Insecurity: Not on file  Transportation Needs: Not on file  Physical Activity: Not on file  Stress: Not on file  Social Connections: Not on  file  Intimate Partner Violence: Not on file    Outpatient Medications Prior to Visit  Medication Sig Dispense Refill   amLODipine (NORVASC) 5 MG tablet Take 1 tablet by mouth once daily 30 tablet 0   carvedilol (COREG) 12.5 MG tablet Take 1 tablet by mouth twice daily 60 tablet 0   No facility-administered medications prior to visit.    No Known Allergies  Review of Systems  Constitutional:        (-)unexpected weight change (-)Adenopathy  HENT:  Negative for hearing loss.        (-)Rhinorrhea   Eyes:        (-)Visual disturbance  Respiratory:  Negative for cough.   Cardiovascular:  Negative for chest pain and leg swelling.  Gastrointestinal:  Negative for blood in stool, constipation, diarrhea, nausea and vomiting.  Genitourinary:  Negative for dysuria and frequency.  Musculoskeletal:  Negative for joint pain and myalgias.  Skin:  Negative for rash.  Neurological:  Negative for headaches.  Psychiatric/Behavioral:  Negative for depression. The patient is not nervous/anxious.       Objective:    Physical Exam Constitutional:      General: She is not in acute distress.    Appearance: Normal appearance. She is not ill-appearing.  HENT:     Head: Normocephalic and atraumatic.     Right Ear: Tympanic membrane, ear canal and external ear normal.     Left Ear: Tympanic membrane, ear canal and external ear normal.  Eyes:     Extraocular Movements: Extraocular movements intact.     Right eye: No nystagmus.     Left eye: No nystagmus.     Pupils: Pupils are equal, round, and reactive to light.  Cardiovascular:     Rate and Rhythm: Normal rate and regular rhythm.     Heart sounds: Normal heart sounds. No murmur heard.   No gallop.     Comments: Her blood pressure measured 152/95 during recheck Pulmonary:     Effort: Pulmonary effort is normal. No respiratory distress.     Breath sounds: Normal breath sounds. No wheezing or rales.  Abdominal:     General: There is no  distension.     Palpations: Abdomen is soft.     Tenderness: There is no abdominal tenderness. There is no guarding.  Musculoskeletal:     Comments: 5/5 strength in both upper and lower extremities  Skin:    General: Skin is warm and dry.  Neurological:     Mental Status: She is alert and oriented to person, place, and time.     Deep Tendon Reflexes:     Reflex Scores:      Patellar reflexes are 2+ on the right side and 2+ on the left side. Psychiatric:  Behavior: Behavior normal.    BP (!) 130/91 (BP Location: Right Arm, Patient Position: Sitting, Cuff Size: Small)   Pulse 67   Temp 98.5 F (36.9 C) (Oral)   Resp 16   Ht _0  (1.473 m)   Wt 167 lb (75.8 kg)   SpO2 100%   BMI 34.90 kg/m  Wt Readings from Last 3 Encounters:  12/23/20 167 lb (75.8 kg)  09/30/20 169 lb (76.7 kg)  06/26/20 165 lb (74.8 kg)       Assessment & Plan:   Problem List Items Addressed This Visit       Unprioritized   Preventative health care    Wt Readings from Last 3 Encounters:  12/23/20 167 lb (75.8 kg)  09/30/20 169 lb (76.7 kg)  06/26/20 165 lb (74.8 kg)  Discussed healthy diet, exercise, weight loss.  BP Readings from Last 3 Encounters:  12/23/20 (!) 130/91  09/30/20 124/68  06/26/20 131/81        Pelvic mass in female    Advised pt to keep her upcoming appointment with GYN to discuss surgical excision.       Essential hypertension    BP is elevated today. Advised pt to check bp once daily at home for 3 days and then send me her readings via mychart. Continue amlodipine 17m and coreg 12.573monce daily.       Relevant Orders   Comp Met (CMET)   Other Visit Diagnoses     Hyperglycemia    -  Primary   Relevant Orders   Hemoglobin A1c        No orders of the defined types were placed in this encounter.   I, MeDebbrah AlarP, personally preformed the services described in this documentation.  All medical record entries made by the scribe were at my  direction and in my presence.  I have reviewed the chart and discharge instructions (if applicable) and agree that the record reflects my personal performance and is accurate and complete. 12/23/2020   I,Shehryar Baig,acting as a scEducation administratoror MeNance PearNP.,have documented all relevant documentation on the behalf of MeNance PearNP,as directed by  MeNance PearNP while in the presence of MeNance PearNP.   MeNance PearNP

## 2020-12-23 NOTE — Assessment & Plan Note (Signed)
Advised pt to keep her upcoming appointment with GYN to discuss surgical excision.

## 2020-12-25 ENCOUNTER — Encounter: Payer: No Typology Code available for payment source | Admitting: Family

## 2021-01-20 ENCOUNTER — Other Ambulatory Visit: Payer: Self-pay | Admitting: Family

## 2021-01-20 MED ORDER — CARVEDILOL 12.5 MG PO TABS: 12.5000 mg | ORAL_TABLET | Freq: Two times a day (BID) | ORAL | 1 refills | Status: DC

## 2021-01-20 MED ORDER — AMLODIPINE BESYLATE 5 MG PO TABS: 5.0000 mg | ORAL_TABLET | Freq: Every day | ORAL | 1 refills | Status: DC

## 2021-01-26 ENCOUNTER — Other Ambulatory Visit: Payer: Self-pay

## 2021-01-26 MED ORDER — CARVEDILOL 12.5 MG PO TABS: 12.5000 mg | ORAL_TABLET | Freq: Two times a day (BID) | ORAL | 1 refills | Status: DC

## 2021-02-02 ENCOUNTER — Encounter (HOSPITAL_BASED_OUTPATIENT_CLINIC_OR_DEPARTMENT_OTHER): Payer: Self-pay

## 2021-02-02 ENCOUNTER — Other Ambulatory Visit: Payer: Self-pay

## 2021-02-02 ENCOUNTER — Ambulatory Visit (HOSPITAL_BASED_OUTPATIENT_CLINIC_OR_DEPARTMENT_OTHER)
Admission: RE | Admit: 2021-02-02 | Discharge: 2021-02-02 | Disposition: A | Discharge status: Home / Self Care | Payer: No Typology Code available for payment source | Source: Ambulatory Visit | Attending: Family | Admitting: Family

## 2021-02-02 DIAGNOSIS — Z1231 Encounter for screening mammogram for malignant neoplasm of breast: Secondary | ICD-10-CM | POA: Insufficient documentation

## 2021-02-04 ENCOUNTER — Encounter: Payer: Self-pay | Admitting: Family Medicine

## 2021-02-04 ENCOUNTER — Other Ambulatory Visit: Payer: Self-pay

## 2021-02-04 ENCOUNTER — Ambulatory Visit (INDEPENDENT_AMBULATORY_CARE_PROVIDER_SITE_OTHER): Payer: No Typology Code available for payment source | Admitting: Family Medicine

## 2021-02-04 DIAGNOSIS — R1900 Intra-abdominal and pelvic swelling, mass and lump, unspecified site: Secondary | ICD-10-CM | POA: Diagnosis not present

## 2021-02-04 NOTE — Assessment & Plan Note (Signed)
Discussed possible options. Will review with a couple of other surgeons to discuss treatment options. Has already had a laparoscopy where they could not see the mass.

## 2021-02-04 NOTE — Progress Notes (Signed)
° °  Subjective:    Patient ID: Bridget Rodriguez is a 40 y.o. female presenting with Follow-up  on 02/04/2021  HPI: Here today to f/u for abnormal pelvic mass noted low in the pelvis. MRI and other imaging suggests possible endometrioma.  Review of Systems  Constitutional:  Negative for chills and fever.  Respiratory:  Negative for shortness of breath.   Cardiovascular:  Negative for chest pain.  Gastrointestinal:  Negative for abdominal pain, nausea and vomiting.  Genitourinary:  Negative for dysuria.  Skin:  Negative for rash.     Objective:    BP (!) 142/95    Pulse 67    Ht 4\' 10"  (1.473 m)    Wt 168 lb (76.2 kg)    LMP 01/31/2021 (Exact Date)    BMI 35.11 kg/m  Physical Exam Constitutional:      General: She is not in acute distress.    Appearance: She is well-developed.  HENT:     Head: Normocephalic and atraumatic.  Eyes:     General: No scleral icterus. Cardiovascular:     Rate and Rhythm: Normal rate.  Pulmonary:     Effort: Pulmonary effort is normal.  Abdominal:     Palpations: Abdomen is soft.  Musculoskeletal:     Cervical back: Neck supple.  Skin:    General: Skin is warm and dry.  Neurological:     Mental Status: She is alert and oriented to person, place, and time.   I viewed the MRI images today. Cyst is c/w endometrioma and abuts the bladder and displaced the uterus and vagina.     Assessment & Plan:   Problem List Items Addressed This Visit       Unprioritized   Pelvic mass in female    Discussed possible options. Will review with a couple of other surgeons to discuss treatment options. Has already had a laparoscopy where they could not see the mass.      Return in about 3 months (around 05/05/2021), or if symptoms worsen or fail to improve.  05/07/2021 02/04/2021 2:13 PM

## 2021-02-05 ENCOUNTER — Ambulatory Visit: Payer: No Typology Code available for payment source

## 2021-02-09 ENCOUNTER — Ambulatory Visit: Payer: Self-pay

## 2021-02-10 DIAGNOSIS — J039 Acute tonsillitis, unspecified: Secondary | ICD-10-CM | POA: Diagnosis not present

## 2021-02-15 ENCOUNTER — Encounter: Payer: Self-pay | Admitting: Family Medicine

## 2021-02-15 ENCOUNTER — Ambulatory Visit (INDEPENDENT_AMBULATORY_CARE_PROVIDER_SITE_OTHER): Payer: 59 | Admitting: Family Medicine

## 2021-02-15 VITALS — BP 128/80 | HR 86 | Temp 98.3°F | Wt 168.0 lb

## 2021-02-15 DIAGNOSIS — J019 Acute sinusitis, unspecified: Secondary | ICD-10-CM | POA: Diagnosis not present

## 2021-02-15 DIAGNOSIS — H6591 Unspecified nonsuppurative otitis media, right ear: Secondary | ICD-10-CM | POA: Diagnosis not present

## 2021-02-15 MED ORDER — PREDNISONE 20 MG PO TABS: 40.0000 mg | ORAL_TABLET | Freq: Every day | ORAL | 0 refills | Status: AC

## 2021-02-15 NOTE — Progress Notes (Signed)
Chief Complaint  Patient presents with   congestion    Sore throat is better still on antibiotic    Bridget Rodriguez here for URI complaints.  Duration: 3 weeks  Associated symptoms: sinus congestion, sinus pain, rhinorrhea, ear fullness, sore throat, and coughing Denies: itchy watery eyes, ear pain, ear drainage, wheezing, shortness of breath, myalgia, N/V, loss of taste/smell, dental pain, and fevers Treatment to date: Mucinex Sick contacts: Yes  Past Medical History:  Diagnosis Date   Eczema 10/28/2016   Essential hypertension 10/28/2016   Fatty liver    Hypertension    Pelvic mass 2017   Benign pelvic floor mass per pt.    Objective BP 128/80    Pulse 86    Temp 98.3 F (36.8 C) (Oral)    Wt 168 lb (76.2 kg)    LMP 02/02/2021    SpO2 98%    BMI 35.11 kg/m  General: Awake, alert, appears stated age HEENT: AT, [place], ears patent b/l and TM' neg on L, slightly bulging w serous fluid behind R  TM; nares patent w/o discharge, pharynx erythematous and without exudates, MMM Neck: No masses or asymmetry Heart: RRR Lungs: CTAB, no accessory muscle use Psych: Age appropriate judgment and insight, normal mood and affect  Acute sinusitis, recurrence not specified, unspecified location - Plan: predniSONE (DELTASONE) 20 MG tablet  Middle ear effusion, right - Plan: predniSONE (DELTASONE) 20 MG tablet  5 d pred burst 40 mg/d. Send message in 2 d if no better.  Continue to push fluids, practice good hand hygiene, cover mouth when coughing. F/u prn. If starting to experience fevers, shaking, or shortness of breath, seek immediate care. Pt voiced understanding and agreement to the plan.  Jilda Roche [place], DO 02/15/21 7:11 AM

## 2021-02-15 NOTE — Patient Instructions (Addendum)
OK to take Tylenol 1000 mg (2 extra strength tabs) or 975 mg (3 regular strength tabs) every 6 hours as needed.  Continue to push fluids, practice good hand hygiene, and cover your mouth if you cough.  If you start having fevers, shaking or shortness of breath, seek immediate care.  Consider throat lozenges, salt water gargles and an air humidifier for symptomatic care.   Let us know if you need anything.

## 2021-02-28 DIAGNOSIS — N390 Urinary tract infection, site not specified: Secondary | ICD-10-CM | POA: Diagnosis not present

## 2021-03-16 ENCOUNTER — Telehealth: Payer: Self-pay | Admitting: General Practice

## 2021-03-16 NOTE — Telephone Encounter (Signed)
Left message on VM informing pt of preop appt with Dr. Erin Fulling on 04/07/2021 at 1:10pm.  Asked patient to contact our office with any questions or concerns.

## 2021-03-22 DIAGNOSIS — R300 Dysuria: Secondary | ICD-10-CM | POA: Diagnosis not present

## 2021-03-22 DIAGNOSIS — N390 Urinary tract infection, site not specified: Secondary | ICD-10-CM | POA: Diagnosis not present

## 2021-03-24 ENCOUNTER — Ambulatory Visit: Payer: 59 | Admitting: Family Medicine

## 2021-03-24 ENCOUNTER — Encounter: Payer: Self-pay | Admitting: Family Medicine

## 2021-03-24 VITALS — BP 108/76 | HR 94 | Temp 98.1°F | Ht <= 58 in | Wt 166.4 lb

## 2021-03-24 DIAGNOSIS — N12 Tubulo-interstitial nephritis, not specified as acute or chronic: Secondary | ICD-10-CM

## 2021-03-24 MED ORDER — ONDANSETRON 4 MG PO TBDP: 4.0000 mg | ORAL_TABLET | Freq: Three times a day (TID) | ORAL | 0 refills | Status: DC | PRN

## 2021-03-24 MED ORDER — SULFAMETHOXAZOLE-TRIMETHOPRIM 800-160 MG PO TABS: 1.0000 | ORAL_TABLET | Freq: Two times a day (BID) | ORAL | 0 refills | Status: AC

## 2021-03-24 NOTE — Progress Notes (Signed)
Chief Complaint  Patient presents with   Chills    Patient currently on antibiotic for a UTI    Bridget Rodriguez is a 41 y.o. female here for possible UTI.  Duration: 2 days. Symptoms: Dysuria, urinary frequency, fever, chills, nausea, and vomiting Denies: hematuria, urinary hesitancy, urinary retention, urinary incontinence, vaginal discharge Hx of recurrent UTI? No Denies new sexual partners.  Past Medical History:  Diagnosis Date   Eczema 10/28/2016   Essential hypertension 10/28/2016   Fatty liver    Hypertension    Pelvic mass 2017   Benign pelvic floor mass per pt.     BP 108/76    Pulse 94    Temp 98.1 F (36.7 C) (Oral)    Ht 4\' 10"  (1.473 m)    Wt 166 lb 6 oz (75.5 kg)    SpO2 99%    BMI 34.77 kg/m  General: Awake, alert, appears stated age Heart: RRR Lungs: CTAB, normal respiratory effort, no accessory muscle usage Abd: BS+, soft, NT, ND, no masses or organomegaly MSK: No CVA tenderness, neg Lloyd's sign Psych: Age appropriate judgment and insight  Pyelonephritis - Plan: sulfamethoxazole-trimethoprim (BACTRIM DS) 800-160 MG tablet, ondansetron (ZOFRAN-ODT) 4 MG disintegrating tablet  Given new s/s's, will tx for above. 7 d of bid Bactrim, Zofran prn.  Stay hydrated. Seek immediate care if pt starts to develop continued fevers on medications, new/worsening symptoms, uncontrollable N/V. F/u prn. The patient voiced understanding and agreement to the plan.  [place], DO 03/24/21 11:18 AM

## 2021-03-24 NOTE — Patient Instructions (Addendum)
Stay hydrated.   If symptoms worsen, please seek immediate care.  OK to take Tylenol 1000 mg (2 extra strength tabs) or 975 mg (3 regular strength tabs) every 6 hours as needed.  Let us know if you need anything.

## 2021-03-26 ENCOUNTER — Ambulatory Visit: Payer: 59 | Admitting: Family Medicine

## 2021-04-07 ENCOUNTER — Institutional Professional Consult (permissible substitution): Payer: 59 | Admitting: Obstetrics & Gynecology

## 2021-04-20 ENCOUNTER — Ambulatory Visit: Admit: 2021-04-20 | Payer: 59 | Admitting: Family Medicine

## 2021-04-20 SURGERY — LAPAROSCOPY, DIAGNOSTIC
Anesthesia: Choice

## 2021-05-03 ENCOUNTER — Encounter: Payer: Self-pay | Admitting: Family Medicine

## 2021-05-03 ENCOUNTER — Ambulatory Visit (INDEPENDENT_AMBULATORY_CARE_PROVIDER_SITE_OTHER): Payer: Commercial Managed Care - PPO | Admitting: Family Medicine

## 2021-05-03 VITALS — BP 137/88 | HR 67 | Temp 98.4°F | Resp 20 | Wt 169.2 lb

## 2021-05-03 DIAGNOSIS — R42 Dizziness and giddiness: Secondary | ICD-10-CM | POA: Diagnosis not present

## 2021-05-03 DIAGNOSIS — R002 Palpitations: Secondary | ICD-10-CM

## 2021-05-03 DIAGNOSIS — R0600 Dyspnea, unspecified: Secondary | ICD-10-CM | POA: Diagnosis not present

## 2021-05-03 LAB — CBC
HCT: 40.2 % (ref 36.0–46.0)
Hemoglobin: 12.5 g/dL (ref 12.0–15.0)
MCHC: 31 g/dL (ref 30.0–36.0)
MCV: 64.7 fl — ABNORMAL LOW (ref 78.0–100.0)
Platelets: 337 10*3/uL (ref 150.0–400.0)
RBC: 6.21 Mil/uL — ABNORMAL HIGH (ref 3.87–5.11)
RDW: 15.3 % (ref 11.5–15.5)
WBC: 7.9 10*3/uL (ref 4.0–10.5)

## 2021-05-03 LAB — COMPREHENSIVE METABOLIC PANEL
ALT: 25 U/L (ref 0–35)
AST: 18 U/L (ref 0–37)
Albumin: 4.6 g/dL (ref 3.5–5.2)
Alkaline Phosphatase: 80 U/L (ref 39–117)
BUN: 13 mg/dL (ref 6–23)
CO2: 28 mEq/L (ref 19–32)
Calcium: 9.2 mg/dL (ref 8.4–10.5)
Chloride: 103 mEq/L (ref 96–112)
Creatinine, Ser: 0.7 mg/dL (ref 0.40–1.20)
GFR: 106.28 mL/min (ref >60.00)
Glucose, Bld: 84 mg/dL (ref 70–99)
Potassium: 3.7 mEq/L (ref 3.5–5.1)
Sodium: 139 mEq/L (ref 135–145)
Total Bilirubin: 0.4 mg/dL (ref 0.2–1.2)
Total Protein: 7.5 g/dL (ref 6.0–8.3)

## 2021-05-03 LAB — MAGNESIUM: Magnesium: 2.3 mg/dL (ref 1.5–2.5)

## 2021-05-03 NOTE — Progress Notes (Signed)
CC: SOB ? ?Subjective: ?Patient is a 41 y.o. female here for SOB. ? ?Over the past week, the patient has had intermittent bouts of shortness of breath and lightheadedness.  She has associated palpitations associated with it, but not every time.  She will sometimes be sitting and sometimes be physically exerting herself when it happens.  She has never lost consciousness and denies any spinning.  She is eating and drinking normally.  Denies any new medication changes.  No recent travel or prolonged bedrest.  No personal or family history of clotting. She is not having URI s/s's, fevers, recent illness, coughing, wheezing, diarrhea, N/V, blood in stool/urine, AUB (did just come off of cycle).  ? ?Past Medical History:  ?Diagnosis Date  ? Eczema 10/28/2016  ? Essential hypertension 10/28/2016  ? Fatty liver   ? Hypertension   ? Pelvic mass 2017  ? Benign pelvic floor mass per pt.  ? ? ?Objective: ?BP 137/88   Pulse 67   Temp 98.4 ?F (36.9 ?C) (Oral)   Resp 20   Wt 169 lb 4 oz (76.8 kg)   SpO2 99%   BMI 35.37 kg/m?  ?General: Awake, appears stated age ?Heart: RRR, no LE edema ?Lungs: CTAB, no rales, wheezes or rhonchi. No accessory muscle use ?Neuro: DTR's equal and symmetric, no clonus, gait normal ?HEENT: Ears neg, nares patent, MMM, no pharyngeal exudate/erythema ?Psych: Age appropriate judgment and insight, normal affect and mood ? ?Assessment and Plan: ?Dyspnea, unspecified type - Plan: CBC, Comprehensive metabolic panel, D-Dimer, Quantitative, EKG 12-Lead ? ?Palpitations - Plan: CBC, Comprehensive metabolic panel, TSH, Magnesium, EKG 12-Lead ? ?Lightheaded - Plan: CBC, EKG 12-Lead ? ?New problem, uncertain prog.  ?EKG- EKG shows NSR, normal axis, no interval abnormalities, no ST segment or T wave changes, good R wave progression.  ?Stay hydrated. Ck labs as above. If neg, will refer to cards. If +, will tx accordingly.  ?F/u as originally scheduled w reg PCP or as needed w me.  ?The patient voiced understanding  and agreement to the plan. ? ?Sharlene Dory, DO ?05/03/21  ?8:10 AM ? ? ? ? ?

## 2021-05-03 NOTE — Patient Instructions (Addendum)
Your EKG is normal.  ? ?Try to stay hydrated. I want your urine overall clear with a yellow tint. ? ?Give [place] 2-3 business days to get the results of your labs back.  ? ?If labs are normal, we will start the process with a cardiology referral.  ? ?Eat regular meals  ? ?Let us know if you need anything. ?

## 2021-05-04 ENCOUNTER — Other Ambulatory Visit: Payer: Self-pay | Admitting: Family Medicine

## 2021-05-04 ENCOUNTER — Encounter: Payer: Self-pay | Admitting: Family Medicine

## 2021-05-04 DIAGNOSIS — R42 Dizziness and giddiness: Secondary | ICD-10-CM

## 2021-05-04 DIAGNOSIS — R0600 Dyspnea, unspecified: Secondary | ICD-10-CM

## 2021-05-04 DIAGNOSIS — R002 Palpitations: Secondary | ICD-10-CM

## 2021-05-04 LAB — TSH: TSH: 4.82 u[IU]/mL (ref 0.35–5.50)

## 2021-05-04 LAB — D-DIMER, QUANTITATIVE: D-Dimer, Quant: 0.26 mcg/mL FEU (ref <0.50)

## 2021-05-21 ENCOUNTER — Ambulatory Visit: Payer: Commercial Managed Care - PPO | Admitting: Cardiology

## 2021-06-16 ENCOUNTER — Other Ambulatory Visit: Payer: Self-pay | Admitting: Family

## 2021-09-07 ENCOUNTER — Other Ambulatory Visit: Payer: Self-pay | Admitting: Family

## 2021-09-07 MED ORDER — AMLODIPINE BESYLATE 5 MG PO TABS: 5.0000 mg | ORAL_TABLET | Freq: Every day | ORAL | 0 refills | Status: DC

## 2021-12-11 ENCOUNTER — Other Ambulatory Visit: Payer: Self-pay | Admitting: Family

## 2021-12-13 ENCOUNTER — Other Ambulatory Visit: Payer: Self-pay | Admitting: Family

## 2022-01-03 ENCOUNTER — Ambulatory Visit (INDEPENDENT_AMBULATORY_CARE_PROVIDER_SITE_OTHER): Payer: 59 | Admitting: Family

## 2022-01-03 ENCOUNTER — Encounter: Payer: Self-pay | Admitting: Family

## 2022-01-03 VITALS — BP 160/105 | HR 67 | Temp 98.2°F | Resp 16 | Ht <= 58 in | Wt 169.0 lb

## 2022-01-03 DIAGNOSIS — R1900 Intra-abdominal and pelvic swelling, mass and lump, unspecified site: Secondary | ICD-10-CM

## 2022-01-03 DIAGNOSIS — Z0000 Encounter for general adult medical examination without abnormal findings: Secondary | ICD-10-CM

## 2022-01-03 DIAGNOSIS — I10 Essential (primary) hypertension: Secondary | ICD-10-CM

## 2022-01-03 DIAGNOSIS — R718 Other abnormality of red blood cells: Secondary | ICD-10-CM

## 2022-01-03 LAB — LIPID PANEL
Cholesterol: 205 mg/dL — ABNORMAL HIGH (ref 0–200)
HDL: 61.8 mg/dL (ref >39.00)
LDL Cholesterol: 105 mg/dL — ABNORMAL HIGH (ref 0–99)
NonHDL: 142.94
Total CHOL/HDL Ratio: 3
Triglycerides: 189 mg/dL — ABNORMAL HIGH (ref 0.0–149.0)
VLDL: 37.8 mg/dL (ref 0.0–40.0)

## 2022-01-03 LAB — CBC WITH DIFFERENTIAL/PLATELET
Basophils Absolute: 0 10*3/uL (ref 0.0–0.1)
Basophils Relative: 0.4 % (ref 0.0–3.0)
Eosinophils Absolute: 0.2 10*3/uL (ref 0.0–0.7)
Eosinophils Relative: 2.6 % (ref 0.0–5.0)
HCT: 40.5 % (ref 36.0–46.0)
Hemoglobin: 12.6 g/dL (ref 12.0–15.0)
Lymphocytes Relative: 28 % (ref 12.0–46.0)
Lymphs Abs: 2.1 10*3/uL (ref 0.7–4.0)
MCHC: 31 g/dL (ref 30.0–36.0)
MCV: 65.4 fl — ABNORMAL LOW (ref 78.0–100.0)
Monocytes Absolute: 0.4 10*3/uL (ref 0.1–1.0)
Monocytes Relative: 5.9 % (ref 3.0–12.0)
Neutro Abs: 4.7 10*3/uL (ref 1.4–7.7)
Neutrophils Relative %: 63.1 % (ref 43.0–77.0)
Platelets: 336 10*3/uL (ref 150.0–400.0)
RBC: 6.2 Mil/uL — ABNORMAL HIGH (ref 3.87–5.11)
RDW: 15.7 % — ABNORMAL HIGH (ref 11.5–15.5)
WBC: 7.4 10*3/uL (ref 4.0–10.5)

## 2022-01-03 LAB — COMPREHENSIVE METABOLIC PANEL
ALT: 59 U/L — ABNORMAL HIGH (ref 0–35)
AST: 33 U/L (ref 0–37)
Albumin: 4.4 g/dL (ref 3.5–5.2)
Alkaline Phosphatase: 97 U/L (ref 39–117)
BUN: 14 mg/dL (ref 6–23)
CO2: 27 mEq/L (ref 19–32)
Calcium: 9.2 mg/dL (ref 8.4–10.5)
Chloride: 99 mEq/L (ref 96–112)
Creatinine, Ser: 0.73 mg/dL (ref 0.40–1.20)
GFR: 100.59 mL/min (ref >60.00)
Glucose, Bld: 89 mg/dL (ref 70–99)
Potassium: 3.7 mEq/L (ref 3.5–5.1)
Sodium: 135 mEq/L (ref 135–145)
Total Bilirubin: 0.6 mg/dL (ref 0.2–1.2)
Total Protein: 7.5 g/dL (ref 6.0–8.3)

## 2022-01-03 LAB — TSH: TSH: 3.27 u[IU]/mL (ref 0.35–5.50)

## 2022-01-03 MED ORDER — VALSARTAN 160 MG PO TABS: 160.0000 mg | ORAL_TABLET | Freq: Every day | ORAL | 1 refills | Status: DC

## 2022-01-03 NOTE — Assessment & Plan Note (Signed)
Wt Readings from Last 3 Encounters:  01/03/22 169 lb (76.7 kg)  05/03/21 169 lb 4 oz (76.8 kg)  03/24/21 166 lb 6 oz (75.5 kg)   Working on diet/exercise, weight loss. Pap up to date. Will schedule mammogram. Recommended covid booster at her pharmacy.

## 2022-01-03 NOTE — Assessment & Plan Note (Signed)
She was scheduled to see Dr. Erin Fulling but had to cancel. Wishes to reschedule to discuss excision. Referral placed.

## 2022-01-03 NOTE — Progress Notes (Signed)
Subjective:   By signing my name below, I, Bridget Rodriguez, attest that this documentation has been prepared under the direction and in the presence of Debbrah Alar, 01/03/2022.   Patient ID: Bridget Rodriguez, female    DOB: 07-03-1980, 41 y.o.   MRN: [id number]  Chief Complaint  Patient presents with   Annual Exam    HPI Patient is in today for a comprehensive physical exam.  She denies new moles, itching, chills, fever, hearing loss, sinus pain, congestion, sore throat, cough and hemoptysis, chest pain, palpitations, wheezing, constipation, diarrhea, blood in stool, nausea and vomiting, dysuria, frequency, hematuria, myalgias and joint pain, depression, anxiety.  Surgery Patient reports that she canceled her surgery to remove a pelvic mass due to schedule conflict with work. She states that the mass creates pressure on her abdominal area and is ready for it to be taken out.  Hypertension Patient's blood pressure is high this visit. She is complaint with her 5 mg Norvasc and 12.5 mg Coreg. She reports that she has been experiencing months of leg swelling and suspects it is due to her Norvasc medication. She is requesting to be taken off of medicine. BP Readings from Last 3 Encounters:  01/03/22 (!) 160/105  05/03/21 137/88  03/24/21 108/76   Pulse Readings from Last 3 Encounters:  01/03/22 67  05/03/21 67  03/24/21 94   Social History- Patient reports that she has no new surgical procedures to report. Pap smear- last completed on 09/30/2020. Mammogram- last completed on 02/02/2021. Exercise- Patient reports that she walks 2-3 times a week. Diet- She reports that she maintains a fair diet about half the time. Vision - She is UTD on vision care. Dental - She is UTD on dental care.   Health Maintenance Due  Topic Date Due   COVID-19 Vaccine (4 - 2023-24 season) 10/08/2021    Past Medical History:  Diagnosis Date   Eczema 10/28/2016   Essential hypertension 10/28/2016    Fatigue 06/26/2020   Fatty liver    Microcytosis 06/26/2020   Pelvic mass in female 09/30/2020   Preventative health care 12/23/2020    Past Surgical History:  Procedure Laterality Date   CESAREAN SECTION  2009   CESAREAN SECTION WITH BILATERAL TUBAL LIGATION Bilateral 2017   DIAGNOSTIC LAPAROSCOPY      Family History  Problem Relation Age of Onset   Hypertension Father     Social History   Socioeconomic History   Marital status: Married    Spouse name: Not on file   Number of children: Not on file   Years of education: Not on file   Highest education level: Not on file  Occupational History   Not on file  Tobacco Use   Smoking status: Never   Smokeless tobacco: Never  Vaping Use   Vaping Use: Never used  Substance and Sexual Activity   Alcohol use: No   Drug use: No   Sexual activity: Yes    Birth control/protection: Surgical  Other Topics Concern   Not on file  Social History Narrative   2 children    2017- daughter- Albina Billet   2009- son- Lennette Bihari   Married   Works as an Therapist, 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    Enjoys restaurants, visiting local sites   Parents and live locally.    Social Determinants of Health   Financial Resource Strain: Not on file  Food Insecurity: No Food Insecurity (02/04/2021)   Hunger Vital Sign    Worried  About Running Out of Food in the Last Year: Never true    Ran Out of Food in the Last Year: Never true  Transportation Needs: No Transportation Needs (02/04/2021)   PRAPARE - Hydrologist (Medical): No    Lack of Transportation (Non-Medical): No  Physical Activity: Not on file  Stress: Not on file  Social Connections: Not on file  Intimate Partner Violence: Not on file    Outpatient Medications Prior to Visit  Medication Sig Dispense Refill   carvedilol (COREG) 12.5 MG tablet Take 1 tablet (12.5 mg total) by mouth 2 (two) times daily. 180 tablet 1   amLODipine (NORVASC) 5 MG tablet Take 1 tablet by mouth  once daily 90 tablet 0   ondansetron (ZOFRAN-ODT) 4 MG disintegrating tablet Take 1 tablet (4 mg total) by mouth every 8 (eight) hours as needed for nausea or vomiting. 20 tablet 0   No facility-administered medications prior to visit.    No Known Allergies  Review of Systems  Constitutional:  Negative for chills and fever.  HENT:  Negative for congestion, sinus pain and sore throat.   Respiratory:  Negative for cough, hemoptysis and wheezing.   Cardiovascular:  Positive for leg swelling. Negative for chest pain and palpitations.  Gastrointestinal:  Negative for blood in stool, constipation, diarrhea, nausea and vomiting.  Genitourinary:  Negative for dysuria, frequency and hematuria.  Musculoskeletal:  Negative for joint pain and myalgias.  Skin:  Negative for itching.       (-) new moles  Psychiatric/Behavioral:  Negative for depression. The patient is not nervous/anxious.        Objective:    Physical Exam Constitutional:      General: She is not in acute distress.    Appearance: Normal appearance. She is not ill-appearing.  HENT:     Head: Normocephalic and atraumatic.     Right Ear: Tympanic membrane, ear canal and external ear normal.     Left Ear: Tympanic membrane, ear canal and external ear normal.  Eyes:     Extraocular Movements: Extraocular movements intact.     Pupils: Pupils are equal, round, and reactive to light.  Cardiovascular:     Rate and Rhythm: Normal rate and regular rhythm.     Heart sounds: Normal heart sounds. No murmur heard.    No gallop.  Pulmonary:     Effort: Pulmonary effort is normal. No respiratory distress.     Breath sounds: Normal breath sounds. No wheezing or rales.  Abdominal:     General: Bowel sounds are normal. There is no distension.     Palpations: Abdomen is soft.     Tenderness: There is no abdominal tenderness. There is no guarding.  Musculoskeletal:     Comments: 5/5 upper and lower extremity strength   Skin:    General:  Skin is warm and dry.  Neurological:     Mental Status: She is alert and oriented to person, place, and time.     Deep Tendon Reflexes:     Reflex Scores:      Patellar reflexes are 2+ on the right side and 2+ on the left side. Psychiatric:        Mood and Affect: Mood normal.        Behavior: Behavior normal.        Judgment: Judgment normal.     BP (!) 160/105   Pulse 67   Temp 98.2 F (36.8 C) (Oral)   Resp  16   Ht 4' 9.7" (1.466 m)   Wt 169 lb (76.7 kg)   SpO2 99%   BMI 35.69 kg/m  Wt Readings from Last 3 Encounters:  01/03/22 169 lb (76.7 kg)  05/03/21 169 lb 4 oz (76.8 kg)  03/24/21 166 lb 6 oz (75.5 kg)       Assessment & Plan:   Problem List Items Addressed This Visit       Unprioritized   Preventative health care - Primary    Wt Readings from Last 3 Encounters:  01/03/22 169 lb (76.7 kg)  05/03/21 169 lb 4 oz (76.8 kg)  03/24/21 166 lb 6 oz (75.5 kg)  Working on diet/exercise, weight loss. Pap up to date. Will schedule mammogram. Recommended covid booster at her pharmacy.       Relevant Orders   MM 3D SCREEN BREAST BILATERAL   Comp Met (CMET)   TSH   Lipid panel   Pelvic mass    She was scheduled to see Dr. Ihor Dow but had to cancel. Wishes to reschedule to discuss excision. Referral placed.       Relevant Orders   Ambulatory referral to Obstetrics / Gynecology   Microcytosis   Relevant Orders   CBC with Differential/Platelet   Essential hypertension    Uncontrolled. Bothered by LE edema secondary to amlodipine.  Will d/c amlodipine. Continue carvedilol, add valsartan 137m once daily. Follow up in 2 weeks.       Relevant Medications   valsartan (DIOVAN) 160 MG tablet   Meds ordered this encounter  Medications   valsartan (DIOVAN) 160 MG tablet    Sig: Take 1 tablet (160 mg total) by mouth daily.    Dispense:  90 tablet    Refill:  1    Order Specific Question:   Supervising Provider    Answer:   BPenni HomansA [[id number]]    I,  MDebbrah Alar personally preformed the services described in this documentation.  All medical record entries made by the scribe were at my direction and in my presence.  I have reviewed the chart and discharge instructions (if applicable) and agree that the record reflects my personal performance and is accurate and complete. 01/03/2022.   I,Verona Buck,acting as a sEducation administratorfor [employer] NP.,have documented all relevant documentation on the behalf of MNance Pear NP,as directed by  MNance Pear NP while in the presence of MNance Pear NP.    MNance Pear NP

## 2022-01-03 NOTE — Assessment & Plan Note (Signed)
Uncontrolled. Bothered by LE edema secondary to amlodipine.  Will d/c amlodipine. Continue carvedilol, add valsartan 160mg  once daily. Follow up in 2 weeks.

## 2022-01-10 ENCOUNTER — Inpatient Hospital Stay (HOSPITAL_BASED_OUTPATIENT_CLINIC_OR_DEPARTMENT_OTHER): Admission: RE | Admit: 2022-01-10 | Payer: 59 | Source: Ambulatory Visit

## 2022-01-10 ENCOUNTER — Encounter (HOSPITAL_BASED_OUTPATIENT_CLINIC_OR_DEPARTMENT_OTHER): Payer: Self-pay

## 2022-01-19 ENCOUNTER — Other Ambulatory Visit: Payer: Self-pay | Admitting: Family

## 2022-01-19 ENCOUNTER — Ambulatory Visit: Payer: 59 | Admitting: Family

## 2022-01-19 VITALS — BP 145/90 | HR 66 | Temp 97.8°F | Resp 18 | Ht <= 58 in | Wt 170.2 lb

## 2022-01-19 DIAGNOSIS — I10 Essential (primary) hypertension: Secondary | ICD-10-CM | POA: Diagnosis not present

## 2022-01-19 LAB — BASIC METABOLIC PANEL
BUN: 15 mg/dL (ref 6–23)
CO2: 26 mEq/L (ref 19–32)
Calcium: 9.2 mg/dL (ref 8.4–10.5)
Chloride: 103 mEq/L (ref 96–112)
Creatinine, Ser: 0.69 mg/dL (ref 0.40–1.20)
GFR: 106.11 mL/min (ref >60.00)
Glucose, Bld: 106 mg/dL — ABNORMAL HIGH (ref 70–99)
Potassium: 3.6 mEq/L (ref 3.5–5.1)
Sodium: 138 mEq/L (ref 135–145)

## 2022-01-19 MED ORDER — VALSARTAN 320 MG PO TABS
320.0000 mg | ORAL_TABLET | Freq: Every day | ORAL | 1 refills | Status: DC
  Filled 2022-03-01: qty 90, 90d supply, fill #0
  Filled 2022-05-31 – 2022-06-10 (×2): qty 60, 60d supply, fill #1

## 2022-01-19 NOTE — Progress Notes (Addendum)
Subjective:     Patient ID: Bridget Rodriguez, female    DOB: [birth year], 41 y.o.   MRN: [id number]  Chief Complaint  Patient presents with   Follow-up    HPI Patient is in today for follow up of her blood pressure. Last visit we dc'd amlodipine due to her c/o edema and started valsartan. She notes some mild HA which she attributes to her elevated blood pressure. She had some dizziness initially when she started the valsartan but this has since resolved.     BP Readings from Last 3 Encounters:  01/19/22 (!) 145/90  01/03/22 (!) 160/105  05/03/21 137/88     Health Maintenance Due  Topic Date Due   COVID-19 Vaccine (4 - 2023-24 season) 10/08/2021    Past Medical History:  Diagnosis Date   Eczema 10/28/2016   Essential hypertension 10/28/2016   Fatigue 06/26/2020   Fatty liver    Microcytosis 06/26/2020   Pelvic mass in female 09/30/2020   Preventative health care 12/23/2020    Past Surgical History:  Procedure Laterality Date   CESAREAN SECTION  2009   CESAREAN SECTION WITH BILATERAL TUBAL LIGATION Bilateral 2017   DIAGNOSTIC LAPAROSCOPY      Family History  Problem Relation Age of Onset   Hypertension Father     Social History   Socioeconomic History   Marital status: Married    Spouse name: Not on file   Number of children: Not on file   Years of education: Not on file   Highest education level: Not on file  Occupational History   Not on file  Tobacco Use   Smoking status: Never   Smokeless tobacco: Never  Vaping Use   Vaping Use: Never used  Substance and Sexual Activity   Alcohol use: No   Drug use: No   Sexual activity: Yes    Birth control/protection: Surgical  Other Topics Concern   Not on file  Social History Narrative   2 children    2017- daughter- Albina Billet   2009- son- Lennette Bihari   Married   Works as an Therapist, 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    Social Determinants of Health    Financial Resource Strain: Not on file  Food Insecurity: No Food Insecurity (02/04/2021)   Hunger Vital Sign    Worried About Running Out of Food in the Last Year: Never true    Ran Out of Food in the Last Year: Never true  Transportation Needs: No Transportation Needs (02/04/2021)   PRAPARE - Hydrologist (Medical): No    Lack of Transportation (Non-Medical): No  Physical Activity: Not on file  Stress: Not on file  Social Connections: Not on file  Intimate Partner Violence: Not on file    Outpatient Medications Prior to Visit  Medication Sig Dispense Refill   carvedilol (COREG) 12.5 MG tablet Take 1 tablet (12.5 mg total) by mouth 2 (two) times daily. 180 tablet 1   valsartan (DIOVAN) 160 MG tablet Take 1 tablet (160 mg total) by mouth daily. 90 tablet 1   No facility-administered medications prior to visit.    No Known Allergies  ROS See HPI     Objective:    Physical Exam Constitutional:      General: She is not in acute distress.    Appearance: Normal appearance. She is well-developed.  HENT:     Head: Normocephalic and  atraumatic.     Right Ear: External ear normal.     Left Ear: External ear normal.  Eyes:     General: No scleral icterus. Neck:     Thyroid: No thyromegaly.  Cardiovascular:     Rate and Rhythm: Normal rate and regular rhythm.     Heart sounds: Normal heart sounds. No murmur heard. Pulmonary:     Effort: Pulmonary effort is normal. No respiratory distress.     Breath sounds: Normal breath sounds. No wheezing.  Musculoskeletal:     Cervical back: Neck supple.  Skin:    General: Skin is warm and dry.  Neurological:     Mental Status: She is alert and oriented to person, place, and time.  Psychiatric:        Mood and Affect: Mood normal.        Behavior: Behavior normal.        Thought Content: Thought content normal.        Judgment: Judgment normal.     BP (!) 145/90   Pulse 66   Temp 97.8 F (36.6  C)   Resp 18   Ht 4' 9.7" (1.466 m)   Wt 170 lb 3.2 oz (77.2 kg)   SpO2 99%   BMI 35.94 kg/m  Wt Readings from Last 3 Encounters:  01/19/22 170 lb 3.2 oz (77.2 kg)  01/03/22 169 lb (76.7 kg)  05/03/21 169 lb 4 oz (76.8 kg)       Assessment & Plan:   Problem List Items Addressed This Visit       Unprioritized   Essential hypertension - Primary    Uncontrolled.  Will obtain follow up bmet. Increase valsartan from 160mg  to 320mg .  Continue coreg.       Relevant Medications   valsartan (DIOVAN) 320 MG tablet   Other Relevant Orders   Basic Metabolic Panel (BMET)    I have discontinued Amilah D. Burkland's valsartan. I am also having her start on valsartan. Additionally, I am having her maintain her carvedilol.  Meds ordered this encounter  Medications   valsartan (DIOVAN) 320 MG tablet    Sig: Take 1 tablet (320 mg total) by mouth daily.    Dispense:  90 tablet    Refill:  1    Order Specific Question:   Supervising Provider    Answer:   A [[id number]]

## 2022-01-19 NOTE — Assessment & Plan Note (Signed)
Uncontrolled.  Will obtain follow up bmet. Increase valsartan from 160mg  to 320mg .  Continue coreg.

## 2022-01-25 ENCOUNTER — Encounter: Payer: Self-pay | Admitting: Family

## 2022-01-25 ENCOUNTER — Ambulatory Visit (INDEPENDENT_AMBULATORY_CARE_PROVIDER_SITE_OTHER): Payer: 59 | Admitting: Family

## 2022-01-25 VITALS — BP 138/95 | HR 64 | Temp 97.6°F | Resp 16 | Wt 169.0 lb

## 2022-01-25 DIAGNOSIS — I10 Essential (primary) hypertension: Secondary | ICD-10-CM | POA: Diagnosis not present

## 2022-01-25 NOTE — Assessment & Plan Note (Signed)
BP Readings from Last 3 Encounters:  01/25/22 (!) 166/93  01/19/22 (!) 145/90  01/03/22 (!) 160/105   138/95 this AM at home, 166/93 here. Suggested addition hctz- she states she had dehydration previously on hctz and wishes not to take it. She wishes to work on diet/exercise more carefully and will reach out to me in 1 month with her readings. If BP still above goal, then she will be willing to start hydralazine at that time.

## 2022-01-25 NOTE — Progress Notes (Addendum)
Subjective:   By signing my name below, I, Bridget Rodriguez, attest that this documentation has been prepared under the direction and in the presence of Bridget Sons, NP 01/25/2022    Patient ID: Bridget Rodriguez, female    DOB: [birth year], 41 y.o.   MRN: [id number]  Chief Complaint  Patient presents with   Hypertension    Here for follow up    HPI Patient is in today for an office visit  Blood Pressure: Since last visit, she has started 320 mg of Valsartan. She states that she is responding to this medication better than her Amlodipine. She is regularly checking her blood sugar levels at home. She states that as of this morning her blood pressure was 138/95 mmHg. The highest at home reading for her was 149/96 mmHg. BP Readings from Last 3 Encounters:  01/25/22 (!) 138/95  01/19/22 (!) 145/90  01/03/22 (!) 160/105   Pulse Readings from Last 3 Encounters:  01/25/22 64  01/19/22 66  01/03/22 67   Health Maintenance Due  Topic Date Due   COVID-19 Vaccine (4 - 2023-24 season) 10/08/2021    Past Medical History:  Diagnosis Date   Eczema 10/28/2016   Essential hypertension 10/28/2016   Fatigue 06/26/2020   Fatty liver    Microcytosis 06/26/2020   Pelvic mass in female 09/30/2020   Preventative health care 12/23/2020    Past Surgical History:  Procedure Laterality Date   CESAREAN SECTION  2009   CESAREAN SECTION WITH BILATERAL TUBAL LIGATION Bilateral 2017   DIAGNOSTIC LAPAROSCOPY      Family History  Problem Relation Age of Onset   Hypertension Father     Social History   Socioeconomic History   Marital status: Married    Spouse name: Not on file   Number of children: Not on file   Years of education: Not on file   Highest education level: Not on file  Occupational History   Not on file  Tobacco Use   Smoking status: Never   Smokeless tobacco: Never  Vaping Use   Vaping Use: Never used  Substance and Sexual Activity   Alcohol use: No   Drug use: No    Sexual activity: Yes    Birth control/protection: Surgical  Other Topics Concern   Not on file  Social History Narrative   2 children    2017- daughter- Bridget Rodriguez   2009- son- Bridget Rodriguez   Married   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    Enjoys restaurants, visiting local sites   Parents and live locally.    Social Determinants of Health   Financial Resource Strain: Not on file  Food Insecurity: No Food Insecurity (02/04/2021)   Hunger Vital Sign    Worried About Running Out of Food in the Last Year: Never true    Ran Out of Food in the Last Year: Never true  Transportation Needs: No Transportation Needs (02/04/2021)   PRAPARE - Administrator, Civil Service (Medical): No    Lack of Transportation (Non-Medical): No  Physical Activity: Not on file  Stress: Not on file  Social Connections: Not on file  Intimate Partner Violence: Not on file    Outpatient Medications Prior to Visit  Medication Sig Dispense Refill   carvedilol (COREG) 12.5 MG tablet Take 1 tablet by mouth twice daily 180 tablet 0   valsartan (DIOVAN) 320 MG tablet Take 1 tablet (320 mg total) by mouth daily. 90 tablet 1  No facility-administered medications prior to visit.    No Known Allergies  ROS    See HPI  Objective:    Physical Exam Constitutional:      General: She is not in acute distress.    Appearance: Normal appearance. She is not ill-appearing.  HENT:     Head: Normocephalic and atraumatic.     Right Ear: External ear normal.     Left Ear: External ear normal.  Eyes:     Extraocular Movements: Extraocular movements intact.     Pupils: Pupils are equal, round, and reactive to light.  Cardiovascular:     Rate and Rhythm: Normal rate and regular rhythm.     Heart sounds: Normal heart sounds. No murmur heard.    No gallop.  Pulmonary:     Effort: Pulmonary effort is normal. No respiratory distress.     Breath sounds: Normal breath sounds. No wheezing or rales.  Skin:     General: Skin is warm and dry.  Neurological:     Mental Status: She is alert and oriented to person, place, and time.  Psychiatric:        Mood and Affect: Mood normal.        Behavior: Behavior normal.        Judgment: Judgment normal.     BP (!) 138/95 Comment: at home reading this am  Pulse 64   Temp 97.6 F (36.4 C) (Oral)   Resp 16   Wt 169 lb (76.7 kg)   SpO2 100%   BMI 35.69 kg/m  Wt Readings from Last 3 Encounters:  01/25/22 169 lb (76.7 kg)  01/19/22 170 lb 3.2 oz (77.2 kg)  01/03/22 169 lb (76.7 kg)       Assessment & Plan:   Problem List Items Addressed This Visit       Unprioritized   Essential hypertension - Primary    BP Readings from Last 3 Encounters:  01/25/22 (!) 166/93  01/19/22 (!) 145/90  01/03/22 (!) 160/105  138/95 this AM at home, 166/93 here. Suggested addition hctz- she states she had dehydration previously on hctz and wishes not to take it. She wishes to work on diet/exercise more carefully and will reach out to me in 1 month with her readings. If BP still above goal, then she will be willing to start hydralazine at that time.         Relevant Orders   Basic Metabolic Panel (BMET)   No orders of the defined types were placed in this encounter.   I, Lemont Fillers, NP, personally preformed the services described in this documentation.  All medical record entries made by the scribe were at my direction and in my presence.  I have reviewed the chart and discharge instructions (if applicable) and agree that the record reflects my personal performance and is accurate and complete. 01/25/2022   I,Amber Collins,acting as a scribe for Lemont Fillers, NP.,have documented all relevant documentation on the behalf of Lemont Fillers, NP,as directed by  Lemont Fillers, NP while in the presence of Lemont Fillers, NP.    Lemont Fillers, NP

## 2022-01-26 LAB — BASIC METABOLIC PANEL
BUN: 16 mg/dL (ref 6–23)
CO2: 26 mEq/L (ref 19–32)
Calcium: 9.4 mg/dL (ref 8.4–10.5)
Chloride: 101 mEq/L (ref 96–112)
Creatinine, Ser: 0.74 mg/dL (ref 0.40–1.20)
GFR: 98.91 mL/min (ref >60.00)
Glucose, Bld: 88 mg/dL (ref 70–99)
Potassium: 3.8 mEq/L (ref 3.5–5.1)
Sodium: 138 mEq/L (ref 135–145)

## 2022-01-27 IMAGING — MG MM DIGITAL SCREENING BILAT W/ TOMO AND CAD
8 series · 8 of 24 positions shown · non-contrast
Comparison: Previous exam(s).

CLINICAL DATA: Screening.

EXAM:
DIGITAL SCREENING BILATERAL MAMMOGRAM WITH TOMOSYNTHESIS AND CAD
TECHNIQUE: Bilateral screening digital craniocaudal and mediolateral oblique
mammograms were obtained. Bilateral screening digital breast
tomosynthesis was performed. The images were evaluated with
computer-aided detection.

[R MLO synth-2D]
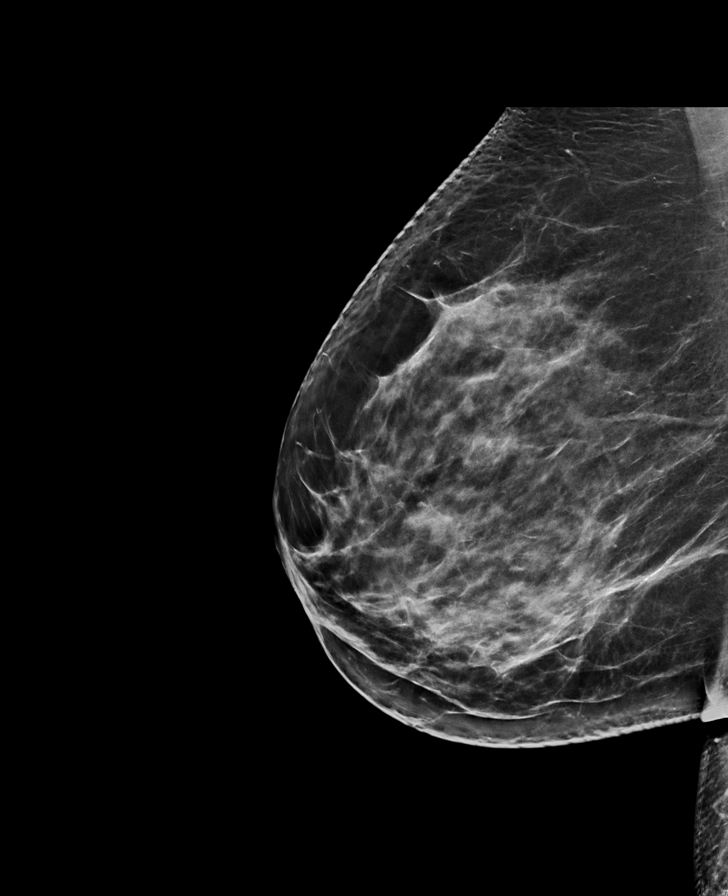

[R CC synth-2D]
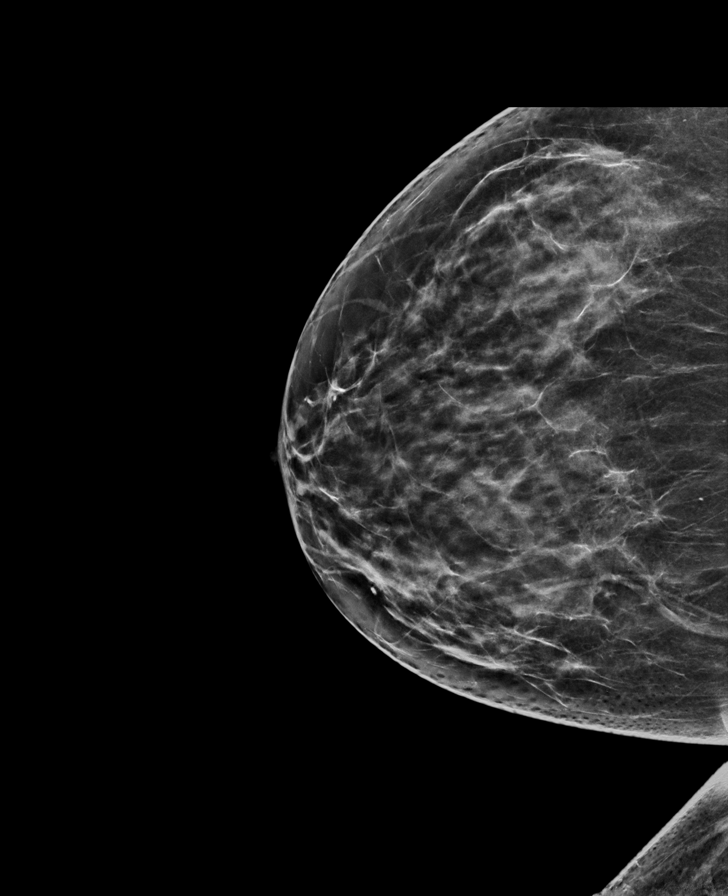

[L MLO synth-2D]
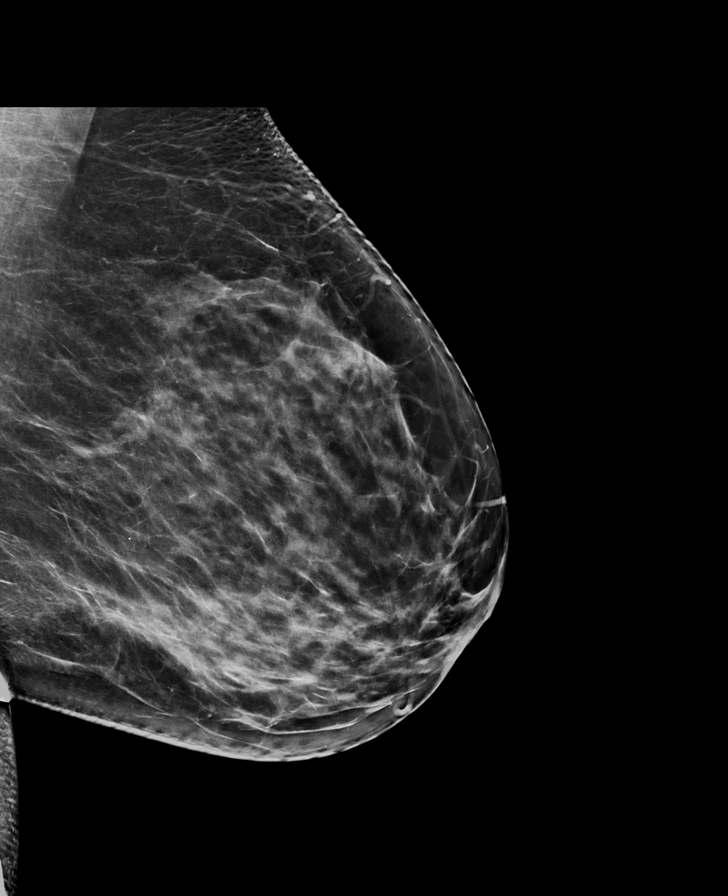

[L CC synth-2D]
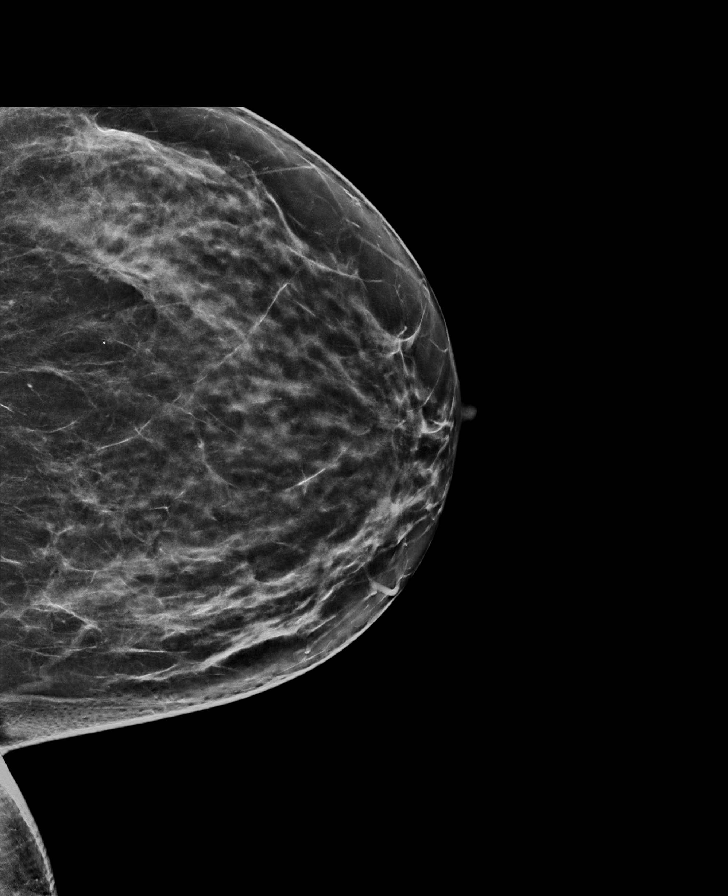

[L CC tomo · tomo slice 36/71.0]
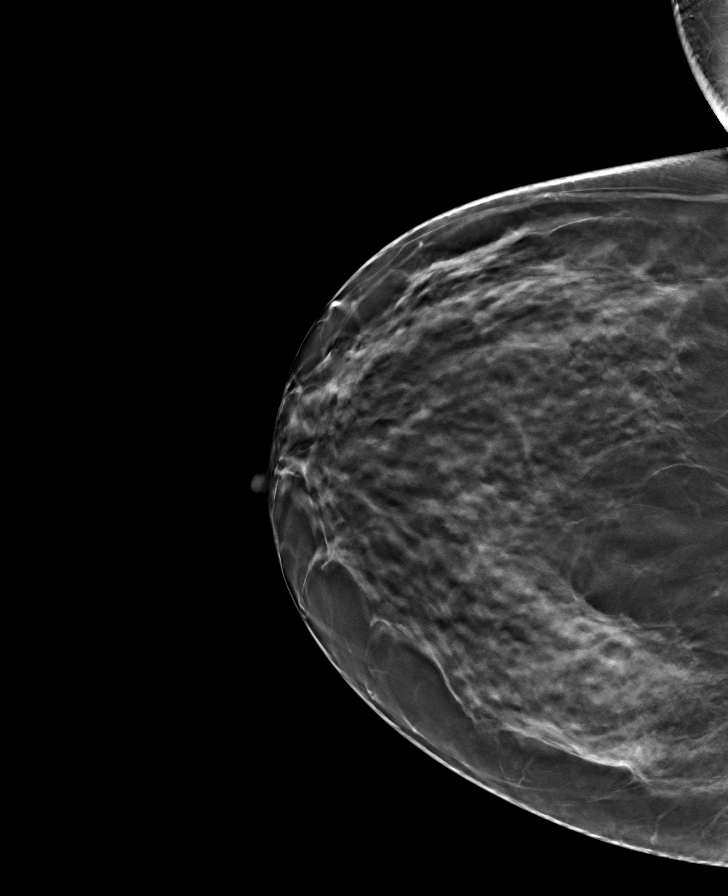

[R MLO tomo · tomo slice 42/83.0]
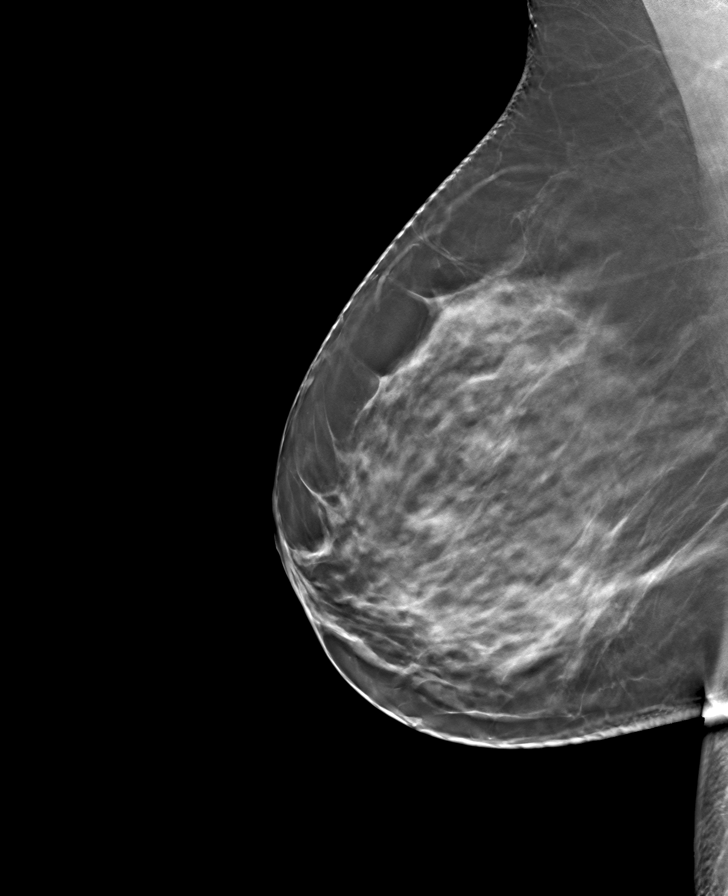

[R CC tomo · tomo slice 37/74.0]
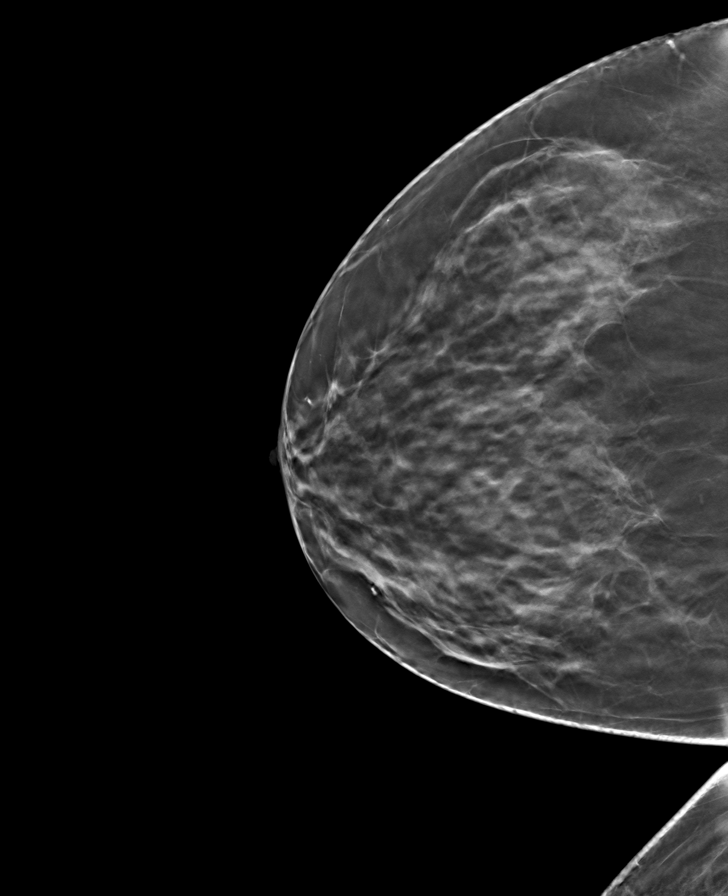

[L MLO tomo · tomo slice 43/85.0]
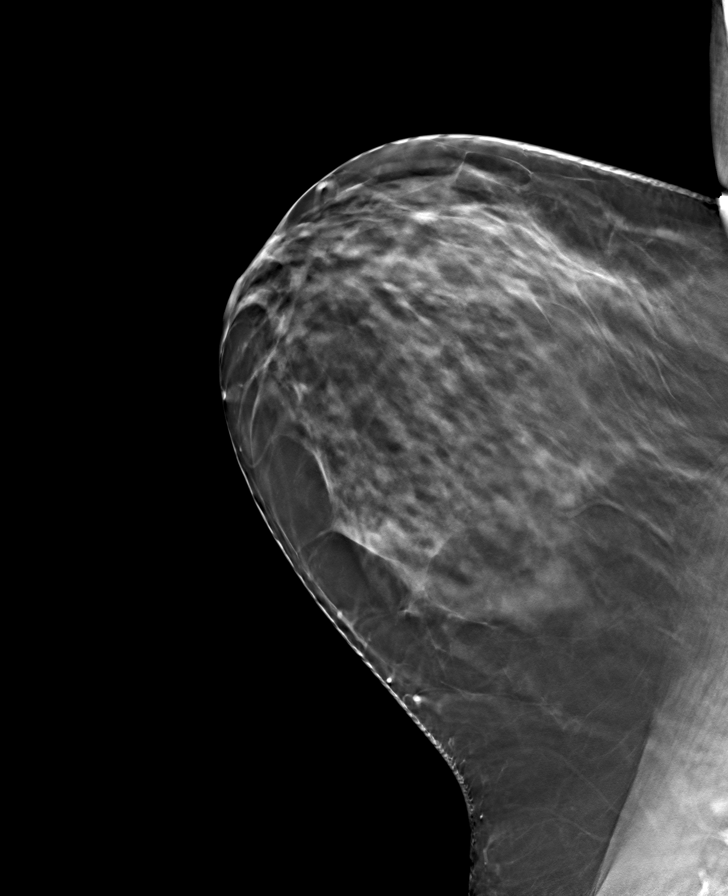

[8 of 24 positions shown; findings below may reference images not displayed]

ACR Breast Density Category c: The breast tissue is heterogeneously
dense, which may obscure small masses.
FINDINGS: There are no findings suspicious for malignancy.
IMPRESSION: No mammographic evidence of malignancy. A result letter of this
screening mammogram will be mailed directly to the patient.

RECOMMENDATION:
Screening mammogram in one year. (Code:Q3-W-BC3)

BI-RADS CATEGORY  1: Negative.

## 2022-02-02 ENCOUNTER — Encounter: Payer: Self-pay | Admitting: Obstetrics & Gynecology

## 2022-02-02 ENCOUNTER — Ambulatory Visit (INDEPENDENT_AMBULATORY_CARE_PROVIDER_SITE_OTHER): Payer: 59 | Admitting: Obstetrics & Gynecology

## 2022-02-02 VITALS — BP 164/92 | HR 68 | Ht <= 58 in | Wt 168.8 lb

## 2022-02-02 DIAGNOSIS — R1900 Intra-abdominal and pelvic swelling, mass and lump, unspecified site: Secondary | ICD-10-CM

## 2022-02-02 NOTE — Progress Notes (Signed)
History:  41 y.o. [id number] here today for eval of pelvic mass and second/third opinion. Pt prev has a pelvic mass that has been present >2 years. She has had an [place] and an MRI. She had a laparoscopy but, was told that it could not be removed during the case. She was unable to come earlier and now wants definitive treatment. She does report pain in her back that she thinks is from the mass. She denies pelvic pain. She has cycles every 1-3 months that last 5 days. She denies significant pain with her menses. Prior to her cycle starting, she takes Tylenol.   h/o C-section x 2 and BTL at time of last c/s. 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   The following portions of the patient's history were reviewed and updated as appropriate: allergies, current medications, past family history, past medical history, past social history, past surgical history and problem list.  Review of Systems:  Pertinent items are noted in HPI.    Objective:  Physical Exam Blood pressure (!) 164/92, pulse 68, height 4\' 9"  (1.448 m), weight 168 lb 12.8 oz (76.6 kg), last menstrual period 01/26/2022.  CONSTITUTIONAL: Well-developed, well-nourished female in no acute distress.  HENT:  Normocephalic, atraumatic EYES: Conjunctivae and EOM are normal. No scleral icterus.  NECK: Normal range of motion SKIN: Skin is warm and dry. No rash noted. Not diaphoretic.No pallor. NEUROLGIC: Alert and oriented to person, place, and time. Normal coordination.  Abd: Soft, nontender and nondistended Pelvic: Normal appearing external genitalia; On digital exam, the mass fills the right adnexa. It is very deep in the pelvis and is palpable in the lower vagina. It is about 12cm in length. The uterus is small uterus, no other palpable masses, no uterine or adnexal tenderness  Labs and Imaging 01/28/2020 CLINICAL DATA:  Known large RIGHT adnexal mass, LMP 12/11/2019   EXAM: TRANSABDOMINAL AND TRANSVAGINAL ULTRASOUND OF PELVIS    TECHNIQUE: Both transabdominal and transvaginal ultrasound examinations of the pelvis were performed. Transabdominal technique was performed for global imaging of the pelvis including uterus, ovaries, adnexal regions, and pelvic cul-de-sac. It was necessary to proceed with endovaginal exam following the transabdominal exam to visualize the endometrium, and to characterize a RIGHT adnexal mass.   COMPARISON:  None   FINDINGS: Uterus   Measurements: 9.6 x 4.5 x 6.4 cm = volume: 143 mL. Retroflexed. Heterogeneous myometrium. Multiple nabothian cysts in cervix. No additional mass   Endometrium   Thickness: 8 mm.  No endometrial fluid or focal abnormality   Right ovary   Measurements: 2.4 x 1.7 x 1.8 cm = volume: 3.9 mL. Normal morphology without mass   Left ovary   Measurements: 2.4 x 1.5 x 2.5 cm = volume: 4.7 mL. Normal morphology without mass   Other findings   No free pelvic fluid. Mass in lower pelvis adjacent to cervix, 9.1 x 10.1 x 8.3 cm, demonstrating fairly homogeneous isoechogenicity throughout. No internal blood flow on color Doppler imaging. Appearance most suggestive of an endometrioma though differential diagnosis includes less likely hemorrhagic cyst and dermoid tumor.   IMPRESSION: Unremarkable uterus, endometrial complex and ovaries.   10.1 x 10.1 x 8.3 cm diameter mass in RIGHT lower pelvis adjacent to cervix most suggestive of an endometrioma though differential diagnosis includes less likely hemorrhagic cyst and dermoid tumor.   Based on size and appearance, either MR imaging with and without contrast or surgical evaluation recommended.  10/24/2020 CLINICAL DATA:  Characterize large right pelvic mass, previously  identified by ultrasound   EXAM: MRI PELVIS WITHOUT AND WITH CONTRAST   TECHNIQUE: Multiplanar multisequence MR imaging of the pelvis was performed both before and after administration of intravenous contrast.   CONTRAST:  7.65mL  GADAVIST GADOBUTROL 1 MMOL/ML IV SOLN   COMPARISON:  Pelvic ultrasound, 01/22/2020   FINDINGS: Urinary Tract:  No abnormality visualized.   Bowel:  Unremarkable visualized pelvic bowel loops.   Vascular/Lymphatic: No pathologically enlarged lymph nodes. No significant vascular abnormality seen.   Reproductive: There is a large cystic mass in the low right hemipelvis, which deflects and distorts the bladder, uterus, vaginal canal, rectum, and right-sided levator musculature. This measures approximately 11.9 x 7.0 x 9.6 cm, and demonstrates intrinsically T1 hyperintense internal signal, as well as some evidence of rim enhancement although without definite mural nodularity (series 3, image 20, series 4, image 17, series 12, image 30, series 13, image 30). C-section scar of the lower uterine segment and multiple nabothian cysts of the cervix. There are multiple subcentimeter follicles of the bilateral ovaries; of note the right ovary is clearly separated from the cystic lesion.   Other:  None.   Musculoskeletal: No suspicious bone lesions identified.   IMPRESSION: 1. There is a large intrinsically T1 hyperintense cystic lesion with hemorrhagic or proteinaceous contents in the low right hemipelvis, which deflects and distorts the bladder, uterus, vaginal canal, rectum, and right-sided levator musculature. This measures approximately 11.9 x 7.0 x 9.6 cm, and demonstrates some evidence of rim enhancement although without definite mural nodularity. This is of uncertain origin, however clearly distinct from the ovary and not significantly changed compared to prior pelvic ultrasound dated 01/22/2020. Differential considerations include an unusually large pelvic endometrioma and a variety of duplication cysts. 2. No evidence of lymphadenopathy within the pelvis. 3. C-section scar of the lower uterine segment and multiple nabothian cysts of the cervix.    Assessment & Plan:  Patient  desires surgical management with exploratory laparotomy with resection of pelvic mass. , possible right salpingo-oophorectomy.  The risks of surgery were discussed in detail with the patient including but not limited to: bleeding which may require transfusion or reoperation; infection which may require prolonged hospitalization or re-hospitalization and antibiotic therapy; injury to bowel, bladder, ureters and major vessels or other surrounding organs; I specifically reviewed with pt the risk to her bowel if this is attached and the possibility of a colostomy. Pt expressed understanding. I reviewed the potential need for additional procedures including laparotomy; thromboembolic phenomenon, incisional problems and other postoperative or anesthesia complications.  Patient was told that the likelihood that her condition and symptoms will be treated effectively with this surgical management was very high; the postoperative expectations were also discussed in detail. The patient also understands the alternative treatment options which were discussed in full. All questions were answered.  She was told that she will be contacted by our surgical scheduler regarding the time and date of her surgery; routine preoperative instructions of having nothing to eat or drink after midnight on the day prior to surgery and also coming to the hospital 1 1/2 hours prior to her time of surgery were also emphasized.  She was told she may be called for a preoperative appointment about a week prior to surgery and will be given further preoperative instructions at that visit. Printed patient education handouts about the procedure were given to the patient to review at home.   Total face-to-face time with patient, review of chart, discussion with consultant and coordination of care  was .    Lincoln Ginley L. Harraway-Smith, M.D., [facility]

## 2022-02-02 NOTE — Patient Instructions (Signed)
Bilateral Salpingo-Oophorectomy  Bilateral salpingo-oophorectomy is the surgical removal of both fallopian tubes and both ovaries. The ovaries are reproductive organs that produce eggs in females. The fallopian tubes allow eggs to move from the ovaries to the uterus. You may need this procedure if you: Have had your uterus removed. This procedure is usually done after the uterus is removed. Have cancer of the fallopian tubes or ovaries. Have a high risk of cancer of the fallopian tubes or ovaries. There are three techniques that can be used for this procedure: Open. One large incision will be made in your abdomen. Laparoscopic. A thin, lighted tube with a small camera (laparoscope) is used to help perform the procedure. The laparoscope allows your surgeon to make several small incisions in the abdomen instead of one large incision. Robot-assisted. A computer is used to control surgical instruments that are attached to robotic arms. A laparoscope may also be used with this technique. After this procedure, you will not be able to become pregnant (will be sterile), and you will go into menopause. Menopause is when you no longer have menstrual periods. Symptoms of menopause can include hot flashes, night sweats, and mood changes. Your sex drive may also be affected. Tell a health care provider about: Any allergies you have. All medicines you are taking, including vitamins, herbs, eye drops, creams, and over-the-counter medicines. Any problems you or family members have had with anesthetic medicines. Any blood disorders you have. Any surgeries you have had. Any medical conditions you have. Whether you are pregnant or may be pregnant. What are the risks? Generally, this is a safe procedure. However, problems may occur, including: Infection. Bleeding. Allergic reactions to medicines. Damage to nearby structures or organs. Blood clots in the legs or lungs. What happens before the  procedure? Staying hydrated Follow instructions from your health care provider about hydration, which may include: Up to 2 hours before the procedure - you may continue to drink clear liquids, such as water, clear fruit juice, black coffee, and plain tea.  Eating and drinking restrictions Follow instructions from your health care provider about eating and drinking, which may include: 8 hours before the procedure - stop eating heavy meals or foods, such as meat, fried foods, or fatty foods. 6 hours before the procedure - stop eating light meals or foods, such as toast or cereal. 6 hours before the procedure - stop drinking milk or drinks that contain milk. 2 hours before the procedure - stop drinking clear liquids. Medicines Ask your health care provider about: Changing or stopping your regular medicines. This is especially important if you are taking diabetes medicines or blood thinners. Taking medicines such as aspirin and ibuprofen. These medicines can thin your blood. Do not take these medicines unless your health care provider tells you to take them. Taking over-the-counter medicines, vitamins, herbs, and supplements. General instructions Do not use any products that contain nicotine or tobacco for at least 4 weeks before the procedure. These products include cigarettes, chewing tobacco, and vaping devices, such as e-cigarettes. If you need help quitting, ask your health care provider. You may have an exam or tests, such as a blood or urine test. Ask your health care provider: How your surgery site will be marked. What steps will be taken to help prevent infection. These steps may include: Removing hair at the surgery site. Washing skin with a germ-killing soap. Taking antibiotic medicine. Plan to have a responsible adult take you home from the hospital or clinic. If you  will be going home right after the procedure, plan to have a responsible adult care for you for the time you are  told. This is important. What happens during the procedure? An IV will be inserted into one of your veins. You will be given one or both of the following: A medicine to help you relax (sedative). A medicine to make you fall asleep (general anesthetic). A small, thin tube (catheter) will be inserted through your urethra and into your bladder. The catheter drains urine during your procedure. Depending on the type of surgery you are having, one incision or several small incisions will be made in your abdomen. Your fallopian tubes and ovaries will be cut away from the uterus and removed. Your blood vessels will be clamped and tied to prevent too much bleeding. The incision or incisions in your abdomen will be closed with stitches (sutures), staples, or skin glue. A bandage (dressing) may be placed over your incision or incisions. The procedure may vary among health care providers and hospitals. What happens after the procedure? Your blood pressure, heart rate, breathing rate, and blood oxygen level will be monitored until you leave the hospital or clinic. You may continue to receive fluids and medicines through an IV. You may continue to have a catheter draining your urine. You may have to wear compression stockings. These stockings help to prevent blood clots and reduce swelling in your legs. You will be given pain medicine as needed. If you were given a sedative during the procedure, it can affect you for several hours. Do not drive or operate machinery until your health care provider says that it is safe. Summary Bilateral salpingo-oophorectomy is a procedure to remove both fallopian tubes and both ovaries. There are three different techniques that can be used for this procedure: open, laparoscopic, and robotic. Talk with your health care provider about how your procedure will be done. After this procedure, you will not be able to become pregnant (will be sterile). You will go into menopause.  This is when you no longer have a menstrual period. Plan to have a responsible adult take you home from the hospital or clinic. This information is not intended to replace advice given to you by your health care provider. Make sure you discuss any questions you have with your health care provider. Document Revised: 12/17/2019 Document Reviewed: 12/17/2019 Elsevier Patient Education  2023 [employer] Exploratory Laparotomy, Adult Exploratory laparotomy is a surgical procedure to examine the organs inside the body by making an incision in the abdomen. You may have this procedure if you have pain in your abdomen, a serious injury (trauma), bleeding, infection, or blockage (obstruction). Exploratory laparotomy may be a planned procedure or an emergency procedure. You may have surgical treatment as part of the laparotomy, or you may have additional treatment after your laparotomy. Tell a health care provider about: Any allergies you have. All medicines you are taking, including vitamins, herbs, eye drops, creams, and over-the-counter medicines. Any problems you or family members have had with anesthetic medicines. Any blood disorders you have. Any surgeries you have had. Any medical conditions you have. Whether you are pregnant or may be pregnant. What are the risks? Generally, this is a safe procedure. However, problems may occur, including: Bleeding. Infection. A blood clot that forms in your leg and travels to your lungs. Damage to organs inside your abdomen. Scar tissue that blocks your digestive tract. What happens before the procedure? Staying hydrated Follow instructions from your health care  provider about hydration, which may include: Up to 2 hours before the procedure - you may continue to drink clear liquids, such as water, clear fruit juice, black coffee, and plain tea. Eating and drinking restrictions Follow instructions from your health care provider about eating and drinking,  which may include: 8 hours before the procedure - stop eating heavy meals or foods, such as meat, fried foods, or fatty foods. 6 hours before the procedure - stop eating light meals or foods, such as toast or cereal. 6 hours before the procedure - stop drinking milk or drinks that contain milk. 2 hours before the procedure - stop drinking clear liquids. Medicines Ask your health care provider about: Changing or stopping your regular medicines. This is especially important if you are taking diabetes medicines or blood thinners. Taking medicines such as aspirin and ibuprofen. These medicines can thin your blood. Do not take these medicines unless your health care provider tells you to take them. Taking over-the-counter medicines, vitamins, herbs, and supplements. Tests You may have imaging studies, such as: Ultrasound. CT scan. MRI. You may have blood or urine tests. General instructions You may be given instructions for clearing out your bowel before surgery (bowel prep). If you are already in the hospital, the bowel prep may be done there. Do not use any products that contain nicotine or tobacco for at least 4 weeks before the procedure. These products include cigarettes, chewing tobacco, and vaping devices, such as e-cigarettes. If you need help quitting, ask your health care provider. Ask your health care provider what steps will be taken to help prevent infection. These steps may include: Removing hair at the surgery site. Washing skin with a germ-killing soap. Taking antibiotic medicine. What happens during the procedure?  An IV will be inserted into one of your veins. You will be given one or more of the following: A medicine to help you relax (sedative). A medicine to make you fall asleep (general anesthetic). A tube may be placed through your nose or mouth and into your stomach (nasogastric tube or NG tube) to drain your stomach fluids. A tube will be placed into your windpipe  (breathing tube) to help you breathe during the procedure. You may have a tube placed into your bladder (urinary catheter) to drain urine. Tight-fitting (compression) stockings may be placed on your legs to promote circulation. Your health care provider will make an incision in your abdomen. This is usually a vertical incision in the middle of your abdomen. All organs of the abdomen will be checked. The rest of the procedure will depend on what your health care provider finds. If there is damage or obstruction in any of the organs, the health care provider will repair it. If there is blood in the abdomen, the health care provider will look for the source of the bleeding to stop it. If there is pus or stomach (gastric) fluids in your abdomen, the health care provider will check for an infection or a hole (perforation) in your digestive tract. If the health care provider finds an infection, a drain may be placed to empty fluid that can build up in your abdomen after surgery. If there is a growth (tumor) inside your abdomen, the health care provider may remove a piece of the growth (biopsy) to examine it under a microscope. When the procedure is complete, the incision will be closed with stitches (sutures), skin glue, or adhesive strips. A bandage (dressing) may be placed over the incision. The procedure may  vary among health care providers and hospitals. What happens after the procedure?  Your blood pressure, heart rate, breathing rate, and blood oxygen level will be monitored until you leave the hospital or clinic. You will continue to receive fluids through your IV. When you are eating and drinking well, the IV will be removed. You may also get antibiotic medicine and pain medicine through your IV. Your nasogastric tube may be removed when you start to pass gas. Your urinary catheter may be removed as soon as you are making enough urine. You may have to wear or continue to wear compression  stockings to help prevent blood clots and reduce swelling in your legs. You will be encouraged to walk as soon as possible. You will also do breathing exercises to keep your lungs clear. Summary Exploratory laparotomy is a surgical procedure to examine the organs inside your abdomen. You may have this procedure if you have pain in your abdomen, trauma, bleeding, infection, or obstruction. Exploratory laparotomy may be a planned procedure or an emergency procedure. This information is not intended to replace advice given to you by your health care provider. Make sure you discuss any questions you have with your health care provider. Document Revised: 11/20/2019 Document Reviewed: 10/08/2019 Elsevier Patient Education  2023 [employer] Inc. Exploratory Laparotomy, Adult, Care After The following information offers guidance on how to care for yourself after your procedure. Your health care provider may also give you more specific instructions. If you have problems or questions, contact your health care provider. What can I expect after the procedure? After the procedure, it is common to have: Abdominal soreness. Fatigue. Bloating. Gas. A sore throat from having had a breathing or draining tube in your throat. A lack of appetite. Follow these instructions at home: Medicines Take over-the-counter and prescription medicines only as told by your health care provider. If you were prescribed an antibiotic medicine, take it as told by your health care provider. Do not stop taking the antibiotic even if you start to feel better. Ask your health care provider if the medicine prescribed to you: Requires you to avoid driving or using machinery. Can cause constipation. You may need to take these actions to prevent or treat constipation: Drink enough fluid to keep your urine pale yellow. Take over-the-counter or prescription medicines. Undergoing surgery and taking pain medicines can make constipation  worse. Eat foods that are high in fiber, such as beans, whole grains, and fresh fruits and vegetables. Limit foods that are high in fat and processed sugars, such as fried or sweet foods. Incision care  Follow instructions from your health care provider about how to take care of your incision. Make sure you: Wash your hands with soap and water for at least 20 seconds before and after you change your bandage (dressing). If soap and water are not available, use hand sanitizer. Change your dressing as told by your health care provider. Leave stitches (sutures), skin glue, or adhesive strips in place. These skin closures may need to stay in place for 2 weeks or longer. If adhesive strip edges start to loosen and curl up, you may trim the loose edges. Do not remove adhesive strips completely unless your health care provider tells you to do that. If you were sent home with a drain, follow instructions from your health care provider about how to care for it. Check your incision area every day for signs of infection. Check for: Redness, swelling, or pain. Fluid or blood. Warmth. Pus or  a bad smell. Activity  Rest as told by your health care provider. Avoid sitting for a long time without moving. Get up to take short walks every 1-2 hours. This is important to improve blood flow and breathing. Ask for help if you feel weak or unsteady. Do not lift anything that is heavier than 5 lb (2.3 kg), or the limit that you are told, until your health care provider says that it is safe. Return to your normal activities as told by your health care provider. Ask your health care provider what activities are safe for you. Bathing Keep your incision clean and dry. Clean it as often as told by your health care provider. You may be told to: Gently wash the incision with soap and water. Rinse the incision with water to remove all soap. Pat the incision dry with a clean towel. Do not rub the incision. General  instructions Do not use any products that contain nicotine or tobacco. These products include cigarettes, chewing tobacco, and vaping devices, such as e-cigarettes. These can delay incision healing after surgery. If you need help quitting, ask your health care provider. Wear compression stockings as told by your health care provider. These stockings help to prevent blood clots and reduce swelling in your legs. Keep all follow-up visits. This is important. Contact a health care provider if: You have a fever or chills. Your pain medicine is not helping. You have constipation or diarrhea. You have nausea or vomiting. You have drainage, redness, swelling, or pain at your incision site. Get help right away if: Your pain is getting worse. You have not had a bowel movement for more than 3 days. You have ongoing (persistent) vomiting. The edges of your incision open up. You have warmth, tenderness, or swelling in your calf. You have trouble breathing. You have chest pain. These symptoms may represent a serious problem that is an emergency. Do not wait to see if the symptoms will go away. Get medical help right away. Call your local emergency services (911 in the [place] Do not drive yourself to the hospital. Summary Abdominal soreness is common after exploratory laparotomy. Take over-the-counter and prescription medicines only as told by your health care provider. Follow instructions from your health care provider about how to take care of your incision. Do not lift anything that is heavier than 5 lb (2.3 kg), or the limit that you are told, until your health care provider says that it is safe. This information is not intended to replace advice given to you by your health care provider. Make sure you discuss any questions you have with your health care provider. Document Revised: 10/08/2019 Document Reviewed: 10/08/2019 Elsevier Patient Education  2023 [employer].

## 2022-02-05 ENCOUNTER — Encounter: Payer: Self-pay | Admitting: Family

## 2022-02-08 MED ORDER — AMLODIPINE BESYLATE 2.5 MG PO TABS: 2.5000 mg | ORAL_TABLET | Freq: Every day | ORAL | 0 refills | Status: DC

## 2022-02-08 NOTE — Addendum Note (Signed)
Addended by: Debbrah Alar on: 02/08/2022 05:09 PM   Modules accepted: Orders

## 2022-02-09 MED ORDER — AMLODIPINE BESYLATE 2.5 MG PO TABS
2.5000 mg | ORAL_TABLET | Freq: Every day | ORAL | 0 refills | Status: DC
  Filled 2022-03-01 – 2022-03-07 (×3): qty 90, 90d supply, fill #0

## 2022-02-09 NOTE — Addendum Note (Signed)
Addended byDamita Dunnings D on: 02/09/2022 01:31 PM   Modules accepted: Orders

## 2022-02-15 ENCOUNTER — Ambulatory Visit: Payer: 59 | Admitting: Family

## 2022-02-15 ENCOUNTER — Ambulatory Visit (INDEPENDENT_AMBULATORY_CARE_PROVIDER_SITE_OTHER): Payer: 59 | Admitting: Family

## 2022-02-15 ENCOUNTER — Encounter: Payer: Self-pay | Admitting: Family

## 2022-02-15 VITALS — BP 136/95 | HR 89 | Temp 98.7°F | Resp 16 | Wt 172.0 lb

## 2022-02-15 DIAGNOSIS — N3000 Acute cystitis without hematuria: Secondary | ICD-10-CM | POA: Diagnosis not present

## 2022-02-15 DIAGNOSIS — R300 Dysuria: Secondary | ICD-10-CM

## 2022-02-15 DIAGNOSIS — J029 Acute pharyngitis, unspecified: Secondary | ICD-10-CM | POA: Diagnosis not present

## 2022-02-15 LAB — POC URINALSYSI DIPSTICK (AUTOMATED)
Blood, UA: NEGATIVE
Glucose, UA: POSITIVE — AB
Nitrite, UA: POSITIVE
Protein, UA: NEGATIVE
Spec Grav, UA: 1.01 (ref 1.010–1.025)
Urobilinogen, UA: 0.2 E.U./dL
pH, UA: 6 (ref 5.0–8.0)

## 2022-02-15 MED ORDER — CEPHALEXIN 500 MG PO CAPS: 500.0000 mg | ORAL_CAPSULE | Freq: Four times a day (QID) | ORAL | 0 refills | Status: DC

## 2022-02-15 NOTE — Assessment & Plan Note (Signed)
New. UA suggests UTI- will send for culture. Begin Keflex.

## 2022-02-15 NOTE — Progress Notes (Signed)
Subjective:   By signing my name below, I, Shehryar Baig, attest that this documentation has been prepared under the direction and in the presence of Sandford Craze, NP. 02/15/2022   Patient ID: Bridget Rodriguez, female    DOB: 12/01/1980, 42 y.o.   MRN: [id number]  Chief Complaint  Patient presents with   Dysuria    Complains of burning with urination   Urinary Frequency    Complains of urinary frequency   Sore Throat    Complains of sore throat with chills and hoarseness     HPI Patient is in today for a office visit.   She complains of congestion for 2 weeks. She also developed sore throat, hoarse voice, chills, urinary burning and frequency on 02/12/2022 and continues having symptoms. She tested negative for Covid-19 on 0/03/2022   Past Medical History:  Diagnosis Date   Eczema 10/28/2016   Essential hypertension 10/28/2016   Fatigue 06/26/2020   Fatty liver    Microcytosis 06/26/2020   Pelvic mass in female 09/30/2020   Preventative health care 12/23/2020    Past Surgical History:  Procedure Laterality Date   CESAREAN SECTION  2009   CESAREAN SECTION WITH BILATERAL TUBAL LIGATION Bilateral 2017   DIAGNOSTIC LAPAROSCOPY      Family History  Problem Relation Age of Onset   Hypertension Father     Social History   Socioeconomic History   Marital status: Married    Spouse name: Not on file   Number of children: Not on file   Years of education: Not on file   Highest education level: Not on file  Occupational History   Not on file  Tobacco Use   Smoking status: Never   Smokeless tobacco: Never  Vaping Use   Vaping Use: Never used  Substance and Sexual Activity   Alcohol use: No   Drug use: No   Sexual activity: Yes    Birth control/protection: Surgical  Other Topics Concern   Not on file  Social History Narrative   2 children    2017- daughter- Grover Canavan   2009- son- Caryn Bee   Married   Works as an Charity fundraiser at [employer] ([employer]    Enjoys restaurants,  visiting local sites   Parents and live locally.    Social Determinants of Health   Financial Resource Strain: Not on file  Food Insecurity: No Food Insecurity (02/04/2021)   Hunger Vital Sign    Worried About Running Out of Food in the Last Year: Never true    Ran Out of Food in the Last Year: Never true  Transportation Needs: No Transportation Needs (02/04/2021)   PRAPARE - Administrator, Civil Service (Medical): No    Lack of Transportation (Non-Medical): No  Physical Activity: Not on file  Stress: Not on file  Social Connections: Not on file  Intimate Partner Violence: Not on file    Outpatient Medications Prior to Visit  Medication Sig Dispense Refill   amLODipine (NORVASC) 2.5 MG tablet Take 1 tablet (2.5 mg total) by mouth daily. 90 tablet 0   carvedilol (COREG) 12.5 MG tablet Take 1 tablet by mouth twice daily 180 tablet 0   valsartan (DIOVAN) 320 MG tablet Take 1 tablet (320 mg total) by mouth daily. 90 tablet 1   No facility-administered medications prior to visit.    No Known Allergies  Review of Systems  HENT:  Positive for congestion and sore throat.        (+)hoarse  voice  Genitourinary:  Positive for dysuria and frequency.       Objective:    Physical Exam Constitutional:      General: She is not in acute distress.    Appearance: Normal appearance. She is not ill-appearing.  HENT:     Head: Normocephalic and atraumatic.     Right Ear: External ear normal.     Left Ear: External ear normal.     Mouth/Throat:     Mouth: Mucous membranes are moist.     Pharynx: Oropharynx is clear. No oropharyngeal exudate or posterior oropharyngeal erythema.  Eyes:     Extraocular Movements: Extraocular movements intact.     Pupils: Pupils are equal, round, and reactive to light.  Cardiovascular:     Rate and Rhythm: Normal rate and regular rhythm.     Heart sounds: Normal heart sounds. No murmur heard.    No gallop.  Pulmonary:     Effort: Pulmonary  effort is normal. No respiratory distress.     Breath sounds: Normal breath sounds. No wheezing or rales.  Lymphadenopathy:     Cervical: No cervical adenopathy.  Skin:    General: Skin is warm and dry.  Neurological:     Mental Status: She is alert and oriented to person, place, and time.  Psychiatric:        Judgment: Judgment normal.     BP (!) 136/95 (BP Location: Right Arm, Patient Position: Sitting, Cuff Size: Small)   Pulse 89   Temp 98.7 F (37.1 C) (Oral)   Resp 16   Wt 172 lb (78 kg)   LMP 01/26/2022   SpO2 98%   BMI 37.22 kg/m  Wt Readings from Last 3 Encounters:  02/15/22 172 lb (78 kg)  02/02/22 168 lb 12.8 oz (76.6 kg)  01/25/22 169 lb (76.7 kg)       Assessment & Plan:  Dysuria -     POCT Urinalysis Dipstick (Automated)  Acute cystitis without hematuria Assessment & Plan: New. UA suggests UTI- will send for culture. Begin Keflex.     Other orders -     Cephalexin; Take 1 capsule (500 mg total) by mouth 4 (four) times daily.  Dispense: 20 capsule; Refill: 0    I, Nance Pear, NP, personally preformed the services described in this documentation.  All medical record entries made by the scribe were at my direction and in my presence.  I have reviewed the chart and discharge instructions (if applicable) and agree that the record reflects my personal performance and is accurate and complete. 02/15/2022   I,Shehryar Baig,acting as a Education administrator for Nance Pear, NP.,have documented all relevant documentation on the behalf of Nance Pear, NP,as directed by  Nance Pear, NP while in the presence of Nance Pear, NP.   Nance Pear, NP

## 2022-02-16 ENCOUNTER — Telehealth: Payer: Self-pay | Admitting: Family

## 2022-02-16 DIAGNOSIS — R81 Glycosuria: Secondary | ICD-10-CM

## 2022-02-16 LAB — POC INFLUENZA A&B (BINAX/QUICKVUE)
Influenza A, POC: NEGATIVE
Influenza B, POC: NEGATIVE

## 2022-02-16 LAB — POC COVID19 BINAXNOW: SARS Coronavirus 2 Ag: POSITIVE — AB

## 2022-02-16 NOTE — Telephone Encounter (Signed)
See mychart.  

## 2022-02-17 LAB — URINE CULTURE
MICRO NUMBER:: 14407843
SPECIMEN QUALITY:: ADEQUATE

## 2022-02-22 ENCOUNTER — Telehealth: Payer: Self-pay | Admitting: Family

## 2022-02-22 NOTE — Telephone Encounter (Signed)
See mychart.

## 2022-03-01 ENCOUNTER — Other Ambulatory Visit (INDEPENDENT_AMBULATORY_CARE_PROVIDER_SITE_OTHER): Payer: 59

## 2022-03-01 ENCOUNTER — Other Ambulatory Visit (HOSPITAL_BASED_OUTPATIENT_CLINIC_OR_DEPARTMENT_OTHER): Payer: Self-pay

## 2022-03-01 ENCOUNTER — Other Ambulatory Visit: Payer: Self-pay | Admitting: Family

## 2022-03-01 DIAGNOSIS — R81 Glycosuria: Secondary | ICD-10-CM

## 2022-03-01 LAB — URINALYSIS, ROUTINE W REFLEX MICROSCOPIC
Bilirubin Urine: NEGATIVE
Hgb urine dipstick: NEGATIVE
Ketones, ur: NEGATIVE
Leukocytes,Ua: NEGATIVE
Nitrite: NEGATIVE
RBC / HPF: NONE SEEN (ref 0–?)
Specific Gravity, Urine: 1.02 (ref 1.000–1.030)
Total Protein, Urine: NEGATIVE
Urine Glucose: NEGATIVE
Urobilinogen, UA: 0.2 (ref 0.0–1.0)
pH: 5.5 (ref 5.0–8.0)

## 2022-03-01 LAB — HEMOGLOBIN A1C: Hgb A1c MFr Bld: 5.9 % (ref 4.6–6.5)

## 2022-03-01 MED ORDER — CARVEDILOL 12.5 MG PO TABS
12.5000 mg | ORAL_TABLET | Freq: Two times a day (BID) | ORAL | 0 refills | Status: DC
  Filled 2022-03-01: qty 180, 90d supply, fill #0

## 2022-03-07 ENCOUNTER — Other Ambulatory Visit (HOSPITAL_BASED_OUTPATIENT_CLINIC_OR_DEPARTMENT_OTHER): Payer: Self-pay

## 2022-03-10 ENCOUNTER — Encounter: Payer: Self-pay | Admitting: General Practice

## 2022-03-14 ENCOUNTER — Ambulatory Visit: Payer: 59 | Admitting: Family Medicine

## 2022-04-08 ENCOUNTER — Encounter (HOSPITAL_COMMUNITY): Payer: Self-pay | Admitting: Obstetrics & Gynecology

## 2022-04-08 ENCOUNTER — Other Ambulatory Visit: Payer: Self-pay

## 2022-04-08 NOTE — Progress Notes (Addendum)
PCP - Dr. Jannetta Quint Cardiologist - Denies  PPM/ICD - Denies  Chest x-ray - Denies EKG - 05/03/2021 Stress Test - Denies ECHO - 06/14/2019 Cardiac Cath - Denies  Sleep study/sleep apnea/CPAP: Denies  Non-diabetic  Blood Thinner Instructions: Denies Aspirin Instructions: Denies  ERAS Protcol - Yes, clear liquids 3 hours prior to surgery  COVID TEST- Denies  Anesthesia review: No  Patient verbally denies any shortness of breath, fever, cough and chest pain during phone call   -------------  SDW INSTRUCTIONS given:  Your procedure is scheduled on Tuesday April 12, 2022.  Report to [facility] Main Entrance "A" at 5:30 A.M., and check in at the Admitting office.  Call this number if you have problems the morning of surgery:  [phone]   Remember:  Do not eat after midnight the night before your surgery  You may drink clear liquids until 4:30 the morning of your surgery.   Clear liquids allowed are: Water, Non-Citrus Juices (without pulp), Carbonated Beverages, Clear Tea, Black Coffee Only, and Gatorade   Take these medicines the morning of surgery with A SIP OF WATER  amLODipine (NORVASC)  carvedilol (COREG)      As of today, STOP taking any Aspirin (unless otherwise instructed by your surgeon) Aleve, Naproxen, Ibuprofen, Motrin, Advil, Goody's, BC's, all herbal medications, fish oil, and all vitamins.                      Do not wear jewelry, make up, or nail polish            Do not wear lotions, powders, perfumes/colognes, or deodorant.            Do not shave 48 hours prior to surgery.              Do not bring valuables to the hospital.             [facility] is not responsible for any belongings or valuables.  Do NOT Smoke (Tobacco/Vaping) 24 hours prior to your procedure  If you use a CPAP at night, you may bring all equipment for your overnight stay.   Contacts, glasses, dentures or bridgework may not be worn into surgery.      For patients  admitted to the hospital, discharge time will be determined by your treatment team.   Patients discharged the day of surgery will not be allowed to drive home, and someone needs to stay with them for 24 hours.    Special instructions:   [place]- Preparing For Surgery  Before surgery, you can play an important role. Because skin is not sterile, your skin needs to be as free of germs as possible. You can reduce the number of germs on your skin by washing with Dial Soap before surgery.     Oral Hygiene is also important to reduce your risk of infection.  Remember - BRUSH YOUR TEETH THE MORNING OF SURGERY WITH YOUR REGULAR TOOTHPASTE   Please follow these instructions carefully.   Shower the NIGHT BEFORE SURGERY and the MORNING OF SURGERY with DIAL Soap.   Pat yourself dry with a CLEAN TOWEL.  Wear CLEAN PAJAMAS to bed the night before surgery  Place CLEAN SHEETS on your bed the night of your first shower and DO NOT SLEEP WITH PETS.   Day of Surgery: Please shower morning of surgery  Wear Clean/Comfortable clothing the morning of surgery Do not apply any deodorants/lotions.   Remember to brush your  teeth WITH YOUR REGULAR TOOTHPASTE.   Questions were answered. Patient verbalized understanding of instructions.

## 2022-04-11 NOTE — Anesthesia Preprocedure Evaluation (Signed)
Anesthesia Evaluation  Patient identified by MRN, date of birth, ID band Patient awake    Reviewed: Allergy & Precautions, NPO status , Patient's Chart, lab work & pertinent test results, reviewed documented beta blocker date and time   Airway Mallampati: I       Dental no notable dental hx.    Pulmonary    Pulmonary exam normal        Cardiovascular hypertension, Pt. on medications and Pt. on home beta blockers Normal cardiovascular exam     Neuro/Psych    GI/Hepatic   Endo/Other    Renal/GU      Musculoskeletal   Abdominal  (+) + obese  Peds  Hematology negative hematology ROS (+)   Anesthesia Other Findings   Reproductive/Obstetrics negative OB ROS                             Anesthesia Physical Anesthesia Plan  ASA: 2  Anesthesia Plan: General   Post-op Pain Management: Dilaudid IV   Induction: Intravenous  PONV Risk Score and Plan: 4 or greater and Ondansetron, Dexamethasone and Midazolam  Airway Management Planned: Oral ETT  Additional Equipment: None  Intra-op Plan:   Post-operative Plan:   Informed Consent: I have reviewed the patients History and Physical, chart, labs and discussed the procedure including the risks, benefits and alternatives for the proposed anesthesia with the patient or authorized representative who has indicated his/her understanding and acceptance.     Dental advisory given  Plan Discussed with: CRNA  Anesthesia Plan Comments:        Anesthesia Quick Evaluation

## 2022-04-12 ENCOUNTER — Inpatient Hospital Stay (HOSPITAL_COMMUNITY)
Admission: RE | Admit: 2022-04-12 | Discharge: 2022-04-13 | DRG: 357 | Disposition: A | Discharge status: Home / Self Care | Payer: 59 | Attending: Obstetrics & Gynecology | Admitting: Obstetrics & Gynecology

## 2022-04-12 ENCOUNTER — Other Ambulatory Visit: Payer: Self-pay

## 2022-04-12 ENCOUNTER — Encounter (HOSPITAL_COMMUNITY)
Admission: RE | Disposition: A | Discharge status: Home / Self Care | Payer: Self-pay | Source: Home / Self Care | Attending: Obstetrics & Gynecology

## 2022-04-12 ENCOUNTER — Inpatient Hospital Stay (HOSPITAL_COMMUNITY): Payer: 59 | Admitting: Anesthesiology

## 2022-04-12 ENCOUNTER — Encounter (HOSPITAL_COMMUNITY): Payer: Self-pay | Admitting: Obstetrics & Gynecology

## 2022-04-12 DIAGNOSIS — R1900 Intra-abdominal and pelvic swelling, mass and lump, unspecified site: Principal | ICD-10-CM

## 2022-04-12 DIAGNOSIS — G8918 Other acute postprocedural pain: Secondary | ICD-10-CM | POA: Diagnosis not present

## 2022-04-12 DIAGNOSIS — N888 Other specified noninflammatory disorders of cervix uteri: Secondary | ICD-10-CM

## 2022-04-12 DIAGNOSIS — D62 Acute posthemorrhagic anemia: Secondary | ICD-10-CM | POA: Diagnosis not present

## 2022-04-12 DIAGNOSIS — I10 Essential (primary) hypertension: Secondary | ICD-10-CM | POA: Diagnosis not present

## 2022-04-12 DIAGNOSIS — Z9889 Other specified postprocedural states: Secondary | ICD-10-CM

## 2022-04-12 HISTORY — PX: CYSTOSCOPY: SHX5120

## 2022-04-12 HISTORY — PX: LAPAROTOMY: SHX154

## 2022-04-12 LAB — CBC
HCT: 43 % (ref 36.0–46.0)
HCT: 28.2 % — ABNORMAL LOW (ref 36.0–46.0)
HCT: 26.9 % — ABNORMAL LOW (ref 36.0–46.0)
Hemoglobin: 12.9 g/dL (ref 12.0–15.0)
Hemoglobin: 8.9 g/dL — ABNORMAL LOW (ref 12.0–15.0)
Hemoglobin: 8.5 g/dL — ABNORMAL LOW (ref 12.0–15.0)
MCH: 19.8 pg — ABNORMAL LOW (ref 26.0–34.0)
MCH: 20.5 pg — ABNORMAL LOW (ref 26.0–34.0)
MCH: 20.4 pg — ABNORMAL LOW (ref 26.0–34.0)
MCHC: 30 g/dL (ref 30.0–36.0)
MCHC: 31.6 g/dL (ref 30.0–36.0)
MCHC: 31.6 g/dL (ref 30.0–36.0)
MCV: 66 fL — ABNORMAL LOW (ref 80.0–100.0)
MCV: 64.8 fL — ABNORMAL LOW (ref 80.0–100.0)
MCV: 64.7 fL — ABNORMAL LOW (ref 80.0–100.0)
Platelets: 303 10*3/uL (ref 150–400)
Platelets: 248 10*3/uL (ref 150–400)
Platelets: 231 10*3/uL (ref 150–400)
RBC: 6.52 MIL/uL — ABNORMAL HIGH (ref 3.87–5.11)
RBC: 4.35 MIL/uL (ref 3.87–5.11)
RBC: 4.16 MIL/uL (ref 3.87–5.11)
RDW: 16.3 % — ABNORMAL HIGH (ref 11.5–15.5)
RDW: 15 % (ref 11.5–15.5)
RDW: 15 % (ref 11.5–15.5)
WBC: 7.9 10*3/uL (ref 4.0–10.5)
WBC: 20.5 10*3/uL — ABNORMAL HIGH (ref 4.0–10.5)
WBC: 15.5 10*3/uL — ABNORMAL HIGH (ref 4.0–10.5)
nRBC: 0 % (ref 0.0–0.2)
nRBC: 0 % (ref 0.0–0.2)
nRBC: 0 % (ref 0.0–0.2)

## 2022-04-12 LAB — PREPARE RBC (CROSSMATCH)

## 2022-04-12 LAB — ABO/RH: ABO/RH(D): AB POS

## 2022-04-12 LAB — POCT PREGNANCY, URINE: Preg Test, Ur: NEGATIVE

## 2022-04-12 SURGERY — LAPAROTOMY
Anesthesia: General | Site: Abdomen | Laterality: Right

## 2022-04-12 MED ORDER — PROPOFOL 10 MG/ML IV BOLUS
INTRAVENOUS | Status: DC | PRN
  Administered 2022-04-12: 200 mg via INTRAVENOUS

## 2022-04-12 MED ORDER — METHYLENE BLUE 1 % INJ SOLN
INTRAVENOUS | Status: DC | PRN
  Administered 2022-04-12: .5 mg via INTRAVENOUS

## 2022-04-12 MED ORDER — ROCURONIUM BROMIDE 10 MG/ML (PF) SYRINGE
PREFILLED_SYRINGE | INTRAVENOUS | Status: DC | PRN
  Administered 2022-04-12: 60 mg via INTRAVENOUS
  Administered 2022-04-12: 10 mg via INTRAVENOUS
  Administered 2022-04-12 (×2): 20 mg via INTRAVENOUS

## 2022-04-12 MED ORDER — POVIDONE-IODINE 10 % EX SWAB
2.0000 | Freq: Once | CUTANEOUS | Status: AC
  Administered 2022-04-12: 2 via TOPICAL

## 2022-04-12 MED ORDER — LACTATED RINGERS IV SOLN: INTRAVENOUS | Status: DC

## 2022-04-12 MED ORDER — FENTANYL CITRATE (PF) 250 MCG/5ML IJ SOLN
INTRAMUSCULAR | Status: AC
  Filled 2022-04-12: qty 5

## 2022-04-12 MED ORDER — MENTHOL 3 MG MT LOZG: 1.0000 | LOZENGE | OROMUCOSAL | Status: DC | PRN

## 2022-04-12 MED ORDER — LACTATED RINGERS IV SOLN: INTRAVENOUS | Status: DC | PRN

## 2022-04-12 MED ORDER — CARVEDILOL 25 MG PO TABS
12.5000 mg | ORAL_TABLET | Freq: Two times a day (BID) | ORAL | Status: DC
  Administered 2022-04-13: 12.5 mg via ORAL
  Filled 2022-04-12: qty 1

## 2022-04-12 MED ORDER — LACTATED RINGERS IV BOLUS
500.0000 mL | Freq: Once | INTRAVENOUS | Status: AC
  Administered 2022-04-12: 500 mL via INTRAVENOUS

## 2022-04-12 MED ORDER — KETOROLAC TROMETHAMINE 30 MG/ML IJ SOLN: 30.0000 mg | Freq: Once | INTRAMUSCULAR | Status: DC

## 2022-04-12 MED ORDER — BUPIVACAINE HCL 0.5 % IJ SOLN
INTRAMUSCULAR | Status: AC
  Filled 2022-04-12: qty 1

## 2022-04-12 MED ORDER — POLYETHYLENE GLYCOL 3350 17 G PO PACK: 17.0000 g | PACK | Freq: Every day | ORAL | Status: DC | PRN

## 2022-04-12 MED ORDER — PROPOFOL 10 MG/ML IV BOLUS
INTRAVENOUS | Status: AC
  Filled 2022-04-12: qty 20

## 2022-04-12 MED ORDER — KETOROLAC TROMETHAMINE 30 MG/ML IJ SOLN
30.0000 mg | Freq: Once | INTRAMUSCULAR | Status: AC | PRN
  Administered 2022-04-12: 30 mg via INTRAVENOUS

## 2022-04-12 MED ORDER — ONDANSETRON HCL 4 MG/2ML IJ SOLN: 4.0000 mg | Freq: Four times a day (QID) | INTRAMUSCULAR | Status: DC | PRN

## 2022-04-12 MED ORDER — ACETAMINOPHEN 325 MG PO TABS: 650.0000 mg | ORAL_TABLET | Freq: Four times a day (QID) | ORAL | Status: DC | PRN

## 2022-04-12 MED ORDER — SIMETHICONE 80 MG PO CHEW: 80.0000 mg | CHEWABLE_TABLET | Freq: Four times a day (QID) | ORAL | Status: DC | PRN

## 2022-04-12 MED ORDER — HYDROMORPHONE HCL 1 MG/ML IJ SOLN: 0.2000 mg | INTRAMUSCULAR | Status: DC | PRN

## 2022-04-12 MED ORDER — ONDANSETRON HCL 4 MG/2ML IJ SOLN
INTRAMUSCULAR | Status: DC | PRN
  Administered 2022-04-12: 4 mg via INTRAVENOUS

## 2022-04-12 MED ORDER — AMLODIPINE BESYLATE 5 MG PO TABS
2.5000 mg | ORAL_TABLET | Freq: Every day | ORAL | Status: DC
  Filled 2022-04-12: qty 1

## 2022-04-12 MED ORDER — KETOROLAC TROMETHAMINE 30 MG/ML IJ SOLN
INTRAMUSCULAR | Status: AC
  Filled 2022-04-12: qty 1

## 2022-04-12 MED ORDER — SUGAMMADEX SODIUM 200 MG/2ML IV SOLN
INTRAVENOUS | Status: DC | PRN
  Administered 2022-04-12: 200 mg via INTRAVENOUS

## 2022-04-12 MED ORDER — HEMOSTATIC AGENTS (NO CHARGE) OPTIME
TOPICAL | Status: DC | PRN
  Administered 2022-04-12: 1 via TOPICAL

## 2022-04-12 MED ORDER — AMISULPRIDE (ANTIEMETIC) 5 MG/2ML IV SOLN
INTRAVENOUS | Status: AC
  Filled 2022-04-12: qty 2

## 2022-04-12 MED ORDER — LIDOCAINE 2% (20 MG/ML) 5 ML SYRINGE
INTRAMUSCULAR | Status: DC | PRN
  Administered 2022-04-12: 60 mg via INTRAVENOUS

## 2022-04-12 MED ORDER — TRAMADOL HCL 50 MG PO TABS: 50.0000 mg | ORAL_TABLET | Freq: Four times a day (QID) | ORAL | Status: DC | PRN

## 2022-04-12 MED ORDER — ACETAMINOPHEN 500 MG PO TABS
1000.0000 mg | ORAL_TABLET | ORAL | Status: AC
  Administered 2022-04-12: 1000 mg via ORAL
  Filled 2022-04-12: qty 2

## 2022-04-12 MED ORDER — PHENYLEPHRINE HCL-NACL 20-0.9 MG/250ML-% IV SOLN
INTRAVENOUS | Status: DC | PRN
  Administered 2022-04-12: 10 ug/min via INTRAVENOUS

## 2022-04-12 MED ORDER — MIDAZOLAM HCL 2 MG/2ML IJ SOLN
INTRAMUSCULAR | Status: AC
  Filled 2022-04-12: qty 2

## 2022-04-12 MED ORDER — DEXAMETHASONE SODIUM PHOSPHATE 4 MG/ML IJ SOLN
INTRAMUSCULAR | Status: DC | PRN
  Administered 2022-04-12: 10 mg via INTRAVENOUS

## 2022-04-12 MED ORDER — ALBUMIN HUMAN 5 % IV SOLN: INTRAVENOUS | Status: DC | PRN

## 2022-04-12 MED ORDER — 0.9 % SODIUM CHLORIDE (POUR BTL) OPTIME
TOPICAL | Status: DC | PRN
  Administered 2022-04-12: 1000 mL
  Administered 2022-04-12: 2000 mL

## 2022-04-12 MED ORDER — FENTANYL CITRATE (PF) 100 MCG/2ML IJ SOLN
INTRAMUSCULAR | Status: DC | PRN
  Administered 2022-04-12: 100 ug via INTRAVENOUS
  Administered 2022-04-12 (×3): 50 ug via INTRAVENOUS

## 2022-04-12 MED ORDER — BISACODYL 10 MG RE SUPP: 10.0000 mg | Freq: Every day | RECTAL | Status: DC | PRN

## 2022-04-12 MED ORDER — CLONIDINE HCL (ANALGESIA) 100 MCG/ML EP SOLN
EPIDURAL | Status: DC | PRN
  Administered 2022-04-12: 100 ug

## 2022-04-12 MED ORDER — OXYCODONE-ACETAMINOPHEN 5-325 MG PO TABS: 1.0000 | ORAL_TABLET | ORAL | Status: DC | PRN

## 2022-04-12 MED ORDER — CHLORHEXIDINE GLUCONATE 0.12 % MT SOLN
OROMUCOSAL | Status: AC
  Administered 2022-04-12: 15 mL via OROMUCOSAL
  Filled 2022-04-12: qty 15

## 2022-04-12 MED ORDER — HYDROMORPHONE HCL 1 MG/ML IJ SOLN: 0.2500 mg | INTRAMUSCULAR | Status: DC | PRN

## 2022-04-12 MED ORDER — SODIUM CHLORIDE 0.9% IV SOLUTION: Freq: Once | INTRAVENOUS | Status: AC

## 2022-04-12 MED ORDER — EPHEDRINE SULFATE-NACL 50-0.9 MG/10ML-% IV SOSY
PREFILLED_SYRINGE | INTRAVENOUS | Status: DC | PRN
  Administered 2022-04-12 (×3): 5 mg via INTRAVENOUS
  Administered 2022-04-12: 10 mg via INTRAVENOUS

## 2022-04-12 MED ORDER — CHLORHEXIDINE GLUCONATE 0.12 % MT SOLN: 15.0000 mL | Freq: Once | OROMUCOSAL | Status: AC

## 2022-04-12 MED ORDER — MEPERIDINE HCL 25 MG/ML IJ SOLN: 6.2500 mg | INTRAMUSCULAR | Status: DC | PRN

## 2022-04-12 MED ORDER — ORAL CARE MOUTH RINSE: 15.0000 mL | Freq: Once | OROMUCOSAL | Status: AC

## 2022-04-12 MED ORDER — DIPHENHYDRAMINE HCL 25 MG PO CAPS: 25.0000 mg | ORAL_CAPSULE | ORAL | Status: DC | PRN

## 2022-04-12 MED ORDER — DEXMEDETOMIDINE HCL IN NACL 80 MCG/20ML IV SOLN
INTRAVENOUS | Status: DC | PRN
  Administered 2022-04-12: 20 ug via BUCCAL

## 2022-04-12 MED ORDER — METHYLENE BLUE 1 % INJ SOLN
INTRAVENOUS | Status: AC
  Filled 2022-04-12: qty 10

## 2022-04-12 MED ORDER — IRBESARTAN 300 MG PO TABS
300.0000 mg | ORAL_TABLET | Freq: Every day | ORAL | Status: DC
  Filled 2022-04-12 (×2): qty 1

## 2022-04-12 MED ORDER — MIDAZOLAM HCL 5 MG/5ML IJ SOLN
INTRAMUSCULAR | Status: DC | PRN
  Administered 2022-04-12: 2 mg via INTRAVENOUS

## 2022-04-12 MED ORDER — DOCUSATE SODIUM 100 MG PO CAPS
100.0000 mg | ORAL_CAPSULE | Freq: Two times a day (BID) | ORAL | Status: DC
  Administered 2022-04-13: 100 mg via ORAL
  Filled 2022-04-12: qty 1

## 2022-04-12 MED ORDER — IBUPROFEN 600 MG PO TABS: 600.0000 mg | ORAL_TABLET | Freq: Four times a day (QID) | ORAL | Status: DC

## 2022-04-12 MED ORDER — ROPIVACAINE HCL 5 MG/ML IJ SOLN
INTRAMUSCULAR | Status: DC | PRN
  Administered 2022-04-12 (×6): 5 mL via PERINEURAL

## 2022-04-12 MED ORDER — DEXAMETHASONE SODIUM PHOSPHATE 10 MG/ML IJ SOLN
INTRAMUSCULAR | Status: DC | PRN
  Administered 2022-04-12: 10 mg

## 2022-04-12 MED ORDER — CEFAZOLIN SODIUM-DEXTROSE 2-4 GM/100ML-% IV SOLN
2.0000 g | Freq: Once | INTRAVENOUS | Status: AC
  Administered 2022-04-12: 2 g via INTRAVENOUS
  Filled 2022-04-12: qty 100

## 2022-04-12 MED ORDER — PROMETHAZINE HCL 25 MG/ML IJ SOLN: 6.2500 mg | INTRAMUSCULAR | Status: DC | PRN

## 2022-04-12 MED ORDER — AMISULPRIDE (ANTIEMETIC) 5 MG/2ML IV SOLN
5.0000 mg | Freq: Once | INTRAVENOUS | Status: AC
  Administered 2022-04-12: 5 mg via INTRAVENOUS

## 2022-04-12 MED ORDER — PANTOPRAZOLE SODIUM 40 MG PO TBEC
40.0000 mg | DELAYED_RELEASE_TABLET | Freq: Every day | ORAL | Status: DC
  Administered 2022-04-12 – 2022-04-13 (×2): 40 mg via ORAL
  Filled 2022-04-12 (×2): qty 1

## 2022-04-12 MED ORDER — KETOROLAC TROMETHAMINE 30 MG/ML IJ SOLN
30.0000 mg | Freq: Four times a day (QID) | INTRAMUSCULAR | Status: AC
  Administered 2022-04-12 – 2022-04-13 (×4): 30 mg via INTRAVENOUS
  Filled 2022-04-12 (×4): qty 1

## 2022-04-12 MED ORDER — ACETAMINOPHEN 500 MG PO TABS
1000.0000 mg | ORAL_TABLET | Freq: Once | ORAL | Status: AC
  Administered 2022-04-12: 1000 mg via ORAL
  Filled 2022-04-12: qty 2

## 2022-04-12 MED ORDER — ZOLPIDEM TARTRATE 5 MG PO TABS: 5.0000 mg | ORAL_TABLET | Freq: Every evening | ORAL | Status: DC | PRN

## 2022-04-12 MED ORDER — ONDANSETRON HCL 4 MG PO TABS: 4.0000 mg | ORAL_TABLET | Freq: Four times a day (QID) | ORAL | Status: DC | PRN

## 2022-04-12 MED ORDER — PHENYLEPHRINE 80 MCG/ML (10ML) SYRINGE FOR IV PUSH (FOR BLOOD PRESSURE SUPPORT)
PREFILLED_SYRINGE | INTRAVENOUS | Status: DC | PRN
  Administered 2022-04-12: 80 ug via INTRAVENOUS

## 2022-04-12 MED ORDER — DEXTROSE IN LACTATED RINGERS 5 % IV SOLN: INTRAVENOUS | Status: AC

## 2022-04-12 SURGICAL SUPPLY — 61 items
APL SKNCLS STERI-STRIP NONHPOA (GAUZE/BANDAGES/DRESSINGS) ×2
BAG COUNTER SPONGE SURGICOUNT (BAG) ×2 IMPLANT
BAG SPNG CNTER NS LX DISP (BAG) ×2
BARRIER ADHS 3X4 INTERCEED (GAUZE/BANDAGES/DRESSINGS) IMPLANT
BENZOIN TINCTURE PRP APPL 2/3 (GAUZE/BANDAGES/DRESSINGS) ×2 IMPLANT
BLADE SURG 10 STRL SS (BLADE) ×2 IMPLANT
BRR ADH 4X3 ABS CNTRL BYND (GAUZE/BANDAGES/DRESSINGS)
CNTNR URN SCR LID CUP LEK RST (MISCELLANEOUS) IMPLANT
CONT SPEC 4OZ STRL OR WHT (MISCELLANEOUS) ×2
DRAPE HALF SHEET 40X57 (DRAPES) IMPLANT
DRAPE UNDERBUTTOCKS STRL (DISPOSABLE) IMPLANT
DRAPE WARM FLUID 44X44 (DRAPES) ×2 IMPLANT
DRSG OPSITE POSTOP 4X10 (GAUZE/BANDAGES/DRESSINGS) IMPLANT
DURAPREP 26ML APPLICATOR (WOUND CARE) ×2 IMPLANT
GAUZE 4X4 16PLY ~~LOC~~+RFID DBL (SPONGE) ×2 IMPLANT
GLOVE BIO SURGEON STRL SZ 6.5 (GLOVE) IMPLANT
GLOVE BIO SURGEON STRL SZ7 (GLOVE) ×2 IMPLANT
GLOVE BIO SURGEON STRL SZ8 (GLOVE) IMPLANT
GLOVE BIOGEL PI IND STRL 6.5 (GLOVE) IMPLANT
GLOVE BIOGEL PI IND STRL 7.0 (GLOVE) ×4 IMPLANT
GLOVE BIOGEL PI IND STRL 7.5 (GLOVE) IMPLANT
GLOVE BIOGEL PI IND STRL 8 (GLOVE) IMPLANT
GLOVE ECLIPSE 7.0 STRL STRAW (GLOVE) IMPLANT
GLOVE SURG SS PI 6.5 STRL IVOR (GLOVE) IMPLANT
GOWN STRL REUS W/ TWL LRG LVL3 (GOWN DISPOSABLE) ×4 IMPLANT
GOWN STRL REUS W/ TWL XL LVL3 (GOWN DISPOSABLE) ×2 IMPLANT
GOWN STRL REUS W/TWL LRG LVL3 (GOWN DISPOSABLE) ×4
GOWN STRL REUS W/TWL XL LVL3 (GOWN DISPOSABLE) ×8
HEMOSTAT ARISTA ABSORB 3G PWDR (HEMOSTASIS) IMPLANT
IV CATH AUTO 14GX1.75 SAFE ORG (IV SOLUTION) IMPLANT
KIT TURNOVER KIT B (KITS) ×2 IMPLANT
MANIFOLD NEPTUNE II (INSTRUMENTS) ×2 IMPLANT
NDL SPNL 18GX3.5 QUINCKE PK (NEEDLE) IMPLANT
NEEDLE HYPO 22GX1.5 SAFETY (NEEDLE) ×2 IMPLANT
NEEDLE SPNL 18GX3.5 QUINCKE PK (NEEDLE) ×2 IMPLANT
NS IRRIG 1000ML POUR BTL (IV SOLUTION) ×2 IMPLANT
PACK ABDOMINAL GYN (CUSTOM PROCEDURE TRAY) ×2 IMPLANT
PAD OB MATERNITY 4.3X12.25 (PERSONAL CARE ITEMS) ×2 IMPLANT
RTRCTR C-SECT PINK 25CM LRG (MISCELLANEOUS) IMPLANT
SET CYSTO W/LG BORE CLAMP LF (SET/KITS/TRAYS/PACK) IMPLANT
SHEET LAVH (DRAPES) IMPLANT
SPIKE FLUID TRANSFER (MISCELLANEOUS) IMPLANT
SPONGE INTESTINAL PEANUT (DISPOSABLE) IMPLANT
SPONGE T-LAP 18X18 ~~LOC~~+RFID (SPONGE) IMPLANT
STRIP CLOSURE SKIN 1/2X4 (GAUZE/BANDAGES/DRESSINGS) ×2 IMPLANT
SUT VIC AB 0 CT1 18XCR BRD8 (SUTURE) IMPLANT
SUT VIC AB 0 CT1 27 (SUTURE) ×4
SUT VIC AB 0 CT1 27XBRD ANBCTR (SUTURE) ×4 IMPLANT
SUT VIC AB 0 CT1 8-18 (SUTURE) ×2
SUT VIC AB 3-0 CT1 27 (SUTURE) ×2
SUT VIC AB 3-0 CT1 TAPERPNT 27 (SUTURE) ×2 IMPLANT
SUT VIC AB 3-0 SH 27 (SUTURE) ×2
SUT VIC AB 3-0 SH 27X BRD (SUTURE) IMPLANT
SUT VIC AB 3-0 SH 8-18 (SUTURE) IMPLANT
SUT VIC AB 4-0 KS 27 (SUTURE) ×2 IMPLANT
SUT VICRYL 0 TIES 12 18 (SUTURE) ×2 IMPLANT
SYR 50ML LL SCALE MARK (SYRINGE) IMPLANT
SYR CONTROL 10ML LL (SYRINGE) ×2 IMPLANT
TOWEL GREEN STERILE FF (TOWEL DISPOSABLE) ×4 IMPLANT
TRAY FOLEY MTR SLVR 14FR STAT (SET/KITS/TRAYS/PACK) IMPLANT
TRAY FOLEY W/BAG SLVR 14FR (SET/KITS/TRAYS/PACK) ×2 IMPLANT

## 2022-04-12 NOTE — H&P (Signed)
Preoperative History and Physical  Bridget Rodriguez is a 42 y.o. [id number] here for surgical management of ride sided pelvic mass.   Proposed surgery: exploratory laparoscopy with resection of pelvic mass. Possible right salpingo-oophorectomy   Past Medical History:  Diagnosis Date   Eczema 10/28/2016   Essential hypertension 10/28/2016   Fatigue 06/26/2020   Fatty liver    Microcytosis 06/26/2020   Pelvic mass in female 09/30/2020   Preventative health care 12/23/2020   Past Surgical History:  Procedure Laterality Date   CESAREAN SECTION  2009   CESAREAN SECTION WITH BILATERAL TUBAL LIGATION Bilateral 2017   DIAGNOSTIC LAPAROSCOPY     OB History     Gravida  2   Para  2   Term  2   Preterm      AB      Living  2      SAB      IAB      Ectopic      Multiple      Live Births  2          Patient denies any cervical dysplasia or STIs. Medications Prior to Admission  Medication Sig Dispense Refill Last Dose   amLODipine (NORVASC) 2.5 MG tablet Take 1 tablet (2.5 mg total) by mouth daily. 90 tablet 0 04/11/2022   carvedilol (COREG) 12.5 MG tablet Take 1 tablet (12.5 mg total) by mouth 2 (two) times daily. 180 tablet 0 04/11/2022   valsartan (DIOVAN) 320 MG tablet Take 1 tablet (320 mg total) by mouth daily. 90 tablet 1 04/11/2022    No Known Allergies Social History:   reports that she has never smoked. She has never used smokeless tobacco. She reports that she does not drink alcohol and does not use drugs. Family History  Problem Relation Age of Onset   Hypertension Father     Review of Systems: Noncontributory  PHYSICAL EXAM: Blood pressure (!) 153/95, pulse 76, temperature 98.3 F (36.8 C), temperature source Oral, resp. rate 17, height '4\' 10"'$  (1.473 m), weight 74.8 kg, last menstrual period 03/27/2022, SpO2 97 %. General appearance - alert, well appearing, and in no distress Chest - clear to auscultation, no wheezes, rales or rhonchi, symmetric air  entry Heart - normal rate and regular rhythm Abdomen - soft, nontender, nondistended, no masses or organomegaly Pelvic - examination not indicated Extremities - peripheral pulses normal, no pedal edema, no clubbing or cyanosis  Labs: Results for orders placed or performed during the hospital encounter of 04/12/22 (from the past 336 hour(s))  Pregnancy, urine POC   Collection Time: 04/12/22  5:54 AM  Result Value Ref Range   Preg Test, Ur NEGATIVE NEGATIVE  CBC   Collection Time: 04/12/22  6:00 AM  Result Value Ref Range   WBC 7.9 4.0 - 10.5 K/uL   RBC 6.52 (H) 3.87 - 5.11 MIL/uL   Hemoglobin 12.9 12.0 - 15.0 g/dL   HCT 43.0 36.0 - 46.0 %   MCV 66.0 (L) 80.0 - 100.0 fL   MCH 19.8 (L) 26.0 - 34.0 pg   MCHC 30.0 30.0 - 36.0 g/dL   RDW 16.3 (H) 11.5 - 15.5 %   Platelets 303 150 - 400 K/uL   nRBC 0.0 0.0 - 0.2 %  Type and screen [place]   Collection Time: 04/12/22  6:00 AM  Result Value Ref Range   ABO/RH(D) AB POS    Antibody Screen NEG    Sample Expiration  04/15/2022,2359 Performed at [facility] [address] [place], [place] [address]   ABO/Rh   Collection Time: 04/12/22  6:06 AM  Result Value Ref Range   ABO/RH(D)      AB POS Performed at [facility] [address], [place], [place] [address]     Imaging Studies: CLINICAL DATA:  Characterize large right pelvic mass, previously identified by ultrasound   EXAM: MRI PELVIS WITHOUT AND WITH CONTRAST   TECHNIQUE: Multiplanar multisequence MR imaging of the pelvis was performed both before and after administration of intravenous contrast.   CONTRAST:  7.39m GADAVIST GADOBUTROL 1 MMOL/ML IV SOLN   COMPARISON:  Pelvic ultrasound, 01/22/2020   FINDINGS: Urinary Tract:  No abnormality visualized.   Bowel:  Unremarkable visualized pelvic bowel loops.   Vascular/Lymphatic: No pathologically enlarged lymph nodes. No significant vascular abnormality seen.   Reproductive:  There is a large cystic mass in the low right hemipelvis, which deflects and distorts the bladder, uterus, vaginal canal, rectum, and right-sided levator musculature. This measures approximately 11.9 x 7.0 x 9.6 cm, and demonstrates intrinsically T1 hyperintense internal signal, as well as some evidence of rim enhancement although without definite mural nodularity (series 3, image 20, series 4, image 17, series 12, image 30, series 13, image 30). C-section scar of the lower uterine segment and multiple nabothian cysts of the cervix. There are multiple subcentimeter follicles of the bilateral ovaries; of note the right ovary is clearly separated from the cystic lesion.   Other:  None.   Musculoskeletal: No suspicious bone lesions identified.   IMPRESSION: 1. There is a large intrinsically T1 hyperintense cystic lesion with hemorrhagic or proteinaceous contents in the low right hemipelvis, which deflects and distorts the bladder, uterus, vaginal canal, rectum, and right-sided levator musculature. This measures approximately 11.9 x 7.0 x 9.6 cm, and demonstrates some evidence of rim enhancement although without definite mural nodularity. This is of uncertain origin, however clearly distinct from the ovary and not significantly changed compared to prior pelvic ultrasound dated 01/22/2020. Differential considerations include an unusually large pelvic endometrioma and a variety of duplication cysts. 2. No evidence of lymphadenopathy within the pelvis. 3. C-section scar of the lower uterine segment and multiple nabothian cysts of the cervix.      Assessment: Patient Active Problem List   Diagnosis Date Noted   Pelvic mass in female 04/12/2022   Acute cystitis without hematuria 02/15/2022   Preventative health care 12/23/2020   Pelvic mass 09/30/2020   Microcytosis 06/26/2020   Fatigue 06/26/2020   Fatty liver    Essential hypertension 10/28/2016   Eczema 10/28/2016     Plan: Patient will undergo surgical management with exploratory laparoscopy with resection of pelvic mass. Possible right salpingo-oophorectomy.  The risks of surgery were discussed in detail with the patient including but not limited to: bleeding which may require transfusion or reoperation; infection which may require antibiotics; injury to surrounding organs which may involve bowel, bladder, ureters ; need for additional procedures including laparoscopy or laparotomy; thromboembolic phenomenon, surgical site problems and other postoperative/anesthesia complications. Likelihood of success in alleviating the patient's condition was discussed. Routine postoperative instructions will be reviewed with the patient and her family in detail after surgery.  The patient concurred with the proposed plan, giving informed written consent for the surgery.  Patient has been NPO since last night she will remain NPO for procedure.  Anesthesia and OR aware.  Preoperative prophylactic antibiotics and SCDs ordered on call to the OR.  To OR when ready.  Chancy Smigiel L. Ihor Dow, M.D., [facility] 04/12/2022 7:21 AM

## 2022-04-12 NOTE — Op Note (Signed)
04/12/2022   10:41 AM   PATIENT:  Bridget Rodriguez  42 y.o. female   PRE-OPERATIVE DIAGNOSIS:  Pelvic Mass   POST-OPERATIVE DIAGNOSIS:  Pelvic Mass   PROCEDURE:  Procedure(s): LAPAROTOMY WITH POSSIBLE RIGHT SALPINGO-OOPHORECTOMY (Right) CYSTOSCOPY   SURGEON:  Surgeon(s) and Role:    * Lavonia Drafts, MD - Primary    * Donnamae Jude, MD - Assisting    * Georganna Skeans, MD - Assisting   ASSISTANTS: Carmelina Dane, MS3    ANESTHESIA:   general and TAP block   EBL:  700 mL    BLOOD ADMINISTERED:none   DRAINS: none    LOCAL MEDICATIONS USED:  MARCAINE      SPECIMEN:  Source of Specimen:  right pelvis    DISPOSITION OF SPECIMEN:  PATHOLOGY   COUNTS:  YES   TOURNIQUET:  * No tourniquets in log *   DICTATION: .Note written in EPIC   PLAN OF CARE: Admit to inpatient    PATIENT DISPOSITION:  PACU - hemodynamically stable.   Delay start of Pharmacological VTE agent (>24hrs) due to surgical blood loss or risk of bleeding: yes   Complications: none immediate   INDICATIONS: The patient is a 42 y.o. [id number] with  a 12 cm pelvic mass that has been present since at least 2018 that is now causing pressure symptoms. Patient desired definitive surgical management. On the preoperative visit, the risks, benefits, indications, and alternatives of the procedure were reviewed with the patient.  On the day of surgery, the risks of surgery were again discussed with the patient including but not limited to: bleeding which may require transfusion or reoperation; infection which may require antibiotics; injury to bowel, bladder, ureters or other surrounding organs; need for additional procedures; thromboembolic phenomenon, incisional problems and other postoperative/anesthesia complications. Written informed consent was obtained.    OPERATIVE FINDINGS: A 6 week size uterus with normal right fallopian tube and ovary.  The left fallopian tube and ovary were not visualized due to being  encased in scar tissue. The bladder was adherent to the anterior surface of the uterus was palpated but, not visualized. There was omentum adherent to the top of the bladder and the anterior abdominal wall. The mass was densely adherent to the side wall laterally and anteriorly. There also adhesions of large bowel to the mass.   DESCRIPTION OF PROCEDURE:  The patient received intravenous antibiotics and had sequential compression devices applied to her lower extremities while in the preoperative area.   She was taken to the operating room and placed under general anesthesia without difficulty.The vagina, abdomen and perineum were prepped and draped in a sterile manner, and she was placed in a dorsal supine position.  A Foley catheter was inserted into the bladder and attached to constant drainage. After an adequate timeout was performed, a wide transverse skin incision was made. This incision was taken down to the fascia using electrocautery with care given to maintain good hemostasis. The fascia was incised in the midline and the fascial incision was then extended bilaterally using electrocautery without difficulty. The fascia was then dissected off the underlying rectus muscles using blunt and sharp dissection. The rectus muscles were split bluntly in the midline and the peritoneum entered sharply without complication. This peritoneal incision was then extended superiorly and inferiorly with care given to prevent bowel or bladder injury.  Attention was then turned to the pelvis. The bowel was packed away with moist laparotomy sponges.   At this point, the cyst/mass  was identified deep in the right pelvis. The bowel and bladder were carefully dissected of the mass using sharp and blunt dissection. To determine the exact location of the bladder, the bladder was back filled with NS and its boundaries identified. The mass pushed the bladder and rectosigmoid anteriorly and laterally to the left.  With careful  dissection, the mass was dissected out. The wall was identified and the cyst was opened. A large amount of what appeared to be very old blood/hemosiderin was identified and removed from the pelvis. Given the proximity to the rectosigmoid colon and the rectum, the assistant and I both went below and did a rectal exam. No blood or stool was noted or defect appreciated. Dr. Grandville Silos from general surgery was also consulted to confirm the integrity of the bowel.   The pelvis was irrigated and hemostasis was reconfirmed at all pedicles.  Arista was applied. All laparotomy sponges and instruments were removed from the abdomen. The rectus muscle was re approximated using 0 Vicryl in a single stitch, and the fascia was also closed in a running fashion with 0 Vicryl.   Methylene blue was given by anesthesia and at this point, I performed cysto. Blue dye was noted from both ureters in a steady jet stream. The bladder was also evaluated and noted to be intact. There was no blood noted in the bladder. The foley was replaced. At this time, the subcutaneous layer was irrigated and reapproximated with 3-0 vicryl. The skin was closed with a 4-0 Vicryl subcuticular stitch. Sponge, lap, needle, and instrument counts were correct times two. The patient was taken to the recovery area awake, extubated and in stable condition.  An experienced assistant was required given the standard of surgical care given the complexity of the case.  This assistant was needed for exposure, dissection, suctioning, retraction, instrument exchange,  and for overall help during the procedure.   Faigy Stretch L. Ihor Dow, MD, [place] Attending [place], [facility]

## 2022-04-12 NOTE — Anesthesia Procedure Notes (Signed)
Procedure Name: Intubation Date/Time: 04/12/2022 7:58 AM  Performed by: Lieutenant Diego, CRNAPre-anesthesia Checklist: Patient identified, Emergency Drugs available, Suction available and Patient being monitored Patient Re-evaluated:Patient Re-evaluated prior to induction Oxygen Delivery Method: Circle system utilized Preoxygenation: Pre-oxygenation with 100% oxygen Induction Type: IV induction Ventilation: Mask ventilation without difficulty Laryngoscope Size: Miller and 2 Grade View: Grade I Tube type: Oral Tube size: 7.0 mm Number of attempts: 1 Airway Equipment and Method: Stylet Placement Confirmation: ETT inserted through vocal cords under direct vision, positive ETCO2 and breath sounds checked- equal and bilateral Secured at: 21 cm Tube secured with: Tape Dental Injury: Teeth and Oropharynx as per pre-operative assessment

## 2022-04-12 NOTE — Addendum Note (Signed)
Addendum  created 04/12/22 1345 by Lyn Hollingshead, MD   Child order released for a procedure order, Clinical Note Signed, Intraprocedure Blocks edited, Intraprocedure Meds edited, SmartForm saved

## 2022-04-12 NOTE — Anesthesia Procedure Notes (Signed)
Anesthesia Regional Block: TAP block   Pre-Anesthetic Checklist: , timeout performed,  Correct Patient, Correct Site, Correct Laterality,  Correct Procedure, Correct Position, site marked,  Risks and benefits discussed,  Surgical consent,  Pre-op evaluation,  At surgeon's request and post-op pain management  Laterality: Right and Lower  Prep: chloraprep       Needles:  Injection technique: Single-shot  Needle Type: Echogenic Needle     Needle Length: 9cm  Needle Gauge: 20   Needle insertion depth: 4 cm   Additional Needles:   Procedures:,,,, ultrasound used (permanent image in chart),,    Narrative:  Start time: 04/12/2022 7:55 AM End time: 04/12/2022 8:05 AM Injection made incrementally with aspirations every 5 mL.  Performed by: Personally  Anesthesiologist: Lyn Hollingshead, MD

## 2022-04-12 NOTE — Brief Op Note (Signed)
04/12/2022  10:41 AM  PATIENT:  Bridget Rodriguez  42 y.o. female  PRE-OPERATIVE DIAGNOSIS:  Pelvic Mass  POST-OPERATIVE DIAGNOSIS:  Pelvic Mass  PROCEDURE:  Procedure(s): LAPAROTOMY WITH POSSIBLE RIGHT SALPINGO-OOPHORECTOMY (Right) CYSTOSCOPY  SURGEON:  Surgeon(s) and Role:    * Lavonia Drafts, MD - Primary    * Donnamae Jude, MD - Assisting    * Georganna Skeans, MD - Assisting  ASSISTANTS: Carmelina Dane, MS3   ANESTHESIA:   general and TAP block  EBL:  700 mL   BLOOD ADMINISTERED:none  DRAINS: none   LOCAL MEDICATIONS USED:  MARCAINE     SPECIMEN:  Source of Specimen:  right pelvis   DISPOSITION OF SPECIMEN:  PATHOLOGY  COUNTS:  YES  TOURNIQUET:  * No tourniquets in log *  DICTATION: .Note written in EPIC  PLAN OF CARE: Admit to inpatient   PATIENT DISPOSITION:  PACU - hemodynamically stable.   Delay start of Pharmacological VTE agent (>24hrs) due to surgical blood loss or risk of bleeding: yes  Complications: none immediate  Bridget Rodriguez L. Bridget Rodriguez, M.D., Bridget Rodriguez

## 2022-04-12 NOTE — Transfer of Care (Signed)
Immediate Anesthesia Transfer of Care Note  Patient: Bridget Rodriguez  Procedure(s) Performed: LAPAROTOMY WITH POSSIBLE RIGHT SALPINGO-OOPHORECTOMY (Right: Abdomen) CYSTOSCOPY  Patient Location: PACU  Anesthesia Type:General  Level of Consciousness: drowsy  Airway & Oxygen Therapy: Patient Spontanous Breathing and Patient connected to nasal cannula oxygen  Post-op Assessment: Report given to RN and Post -op Vital signs reviewed and stable  Post vital signs: Reviewed and stable  Last Vitals:  Vitals Value Taken Time  BP    Temp    Pulse 71 04/12/22 1106  Resp 27 04/12/22 1106  SpO2 97 % 04/12/22 1106  Vitals shown include unvalidated device data.  Last Pain:  Vitals:   04/12/22 0602  TempSrc:   PainSc: 0-No pain         Complications: No notable events documented.

## 2022-04-12 NOTE — Op Note (Signed)
  04/12/2022  10:11 AM  PATIENT:  Bridget Rodriguez  42 y.o. female  PRE-OPERATIVE DIAGNOSIS:  Pelvic Mass, intra-op consult  POST-OPERATIVE DIAGNOSIS:  Pelvic Mass with no noted fistulous connection to the rectosigmoid, intra-op consult  PROCEDURE:  Procedure(s): Intraoperative evaluation of pelvic mass  SURGEON:  Georganna Skeans, MD  ASSISTANTS: Lavonia Drafts, MD   ANESTHESIA:   general  EBL:  Total I/O In: 1350 [I.V.:1000; IV Piggyback:350] Out: 1300 [Urine:600; Blood:700]  BLOOD ADMINISTERED:none  DRAINS: none   SPECIMEN:  see other note  DISPOSITION OF SPECIMEN:  N/A  COUNTS:  YES  DICTATION: .Dragon Dictation I was called for an intraoperative consult regarding a right-sided pelvic mass.  Patient is undergoing surgery by Dr. Ihor Dow.  They identified a mass which she feels is a likely endometrioma along the right side of her rectosigmoid.  Prior to my arrival, they did a rectal exam there was no blood or unusual fluid or material.  They opened and excised a good part of the mass prior to my arrival.  There was a lot of light brown contents and it.  I scrubbed in and looked at the mass.  It is along the right side of the rectosigmoid.  It has been opened.  It has a smooth cyst wall along the inside.  I do not see any connection to the rectum from the cyst wall and I also cannot feel any such connection.  The rectosigmoid is palpated and feels normal.  I agree this is separate and does not appear to have any fistulous connection to the rectosigmoid.  The patient remained in the operating room with Dr. Ihor Dow. PATIENT DISPOSITION:   remains in OR   Delay start of Pharmacological VTE agent (>24hrs) due to surgical blood loss or risk of bleeding:  not applicable  Georganna Skeans, MD, MPH, FACS Pager: [phone]  3/5/202410:11 AM

## 2022-04-12 NOTE — Anesthesia Postprocedure Evaluation (Signed)
Anesthesia Post Note  Patient: Bridget Rodriguez  Procedure(s) Performed: LAPAROTOMY WITH POSSIBLE RIGHT SALPINGO-OOPHORECTOMY (Right: Abdomen) CYSTOSCOPY     Patient location during evaluation: PACU Anesthesia Type: General Level of consciousness: awake and sedated Pain management: pain level controlled Vital Signs Assessment: post-procedure vital signs reviewed and stable Respiratory status: spontaneous breathing Cardiovascular status: stable Postop Assessment: no apparent nausea or vomiting Anesthesia complication: PONV.  No notable events documented.  Last Vitals:  Vitals:   04/12/22 1200 04/12/22 1215  BP: 98/62   Pulse: 64   Resp: 18   Temp:  36.5 C  SpO2: 98%     Last Pain:  Vitals:   04/12/22 1200  TempSrc:   PainSc: [place] Jr

## 2022-04-13 ENCOUNTER — Other Ambulatory Visit (HOSPITAL_BASED_OUTPATIENT_CLINIC_OR_DEPARTMENT_OTHER): Payer: Self-pay

## 2022-04-13 ENCOUNTER — Other Ambulatory Visit: Payer: Self-pay | Admitting: Obstetrics & Gynecology

## 2022-04-13 ENCOUNTER — Encounter (HOSPITAL_COMMUNITY): Payer: Self-pay | Admitting: Obstetrics & Gynecology

## 2022-04-13 LAB — CBC
HCT: 34.3 % — ABNORMAL LOW (ref 36.0–46.0)
Hemoglobin: 10.9 g/dL — ABNORMAL LOW (ref 12.0–15.0)
MCH: 22.4 pg — ABNORMAL LOW (ref 26.0–34.0)
MCHC: 31.8 g/dL (ref 30.0–36.0)
MCV: 70.6 fL — ABNORMAL LOW (ref 80.0–100.0)
Platelets: 277 10*3/uL (ref 150–400)
RBC: 4.86 MIL/uL (ref 3.87–5.11)
RDW: 20 % — ABNORMAL HIGH (ref 11.5–15.5)
WBC: 16.4 10*3/uL — ABNORMAL HIGH (ref 4.0–10.5)
nRBC: 0 % (ref 0.0–0.2)

## 2022-04-13 LAB — TYPE AND SCREEN
ABO/RH(D): AB POS
Antibody Screen: NEGATIVE
Unit division: 0
Unit division: 0

## 2022-04-13 LAB — BPAM RBC
Blood Product Expiration Date: 202403272359
Blood Product Expiration Date: 202403292359
ISSUE DATE / TIME: 202403051843
ISSUE DATE / TIME: 202403052112
Unit Type and Rh: 6200
Unit Type and Rh: 6200

## 2022-04-13 LAB — SURGICAL PATHOLOGY

## 2022-04-13 MED ORDER — DOCUSATE SODIUM 100 MG PO CAPS
100.0000 mg | ORAL_CAPSULE | Freq: Two times a day (BID) | ORAL | 0 refills | Status: DC
  Filled 2022-04-13: qty 100, 50d supply, fill #0

## 2022-04-13 MED ORDER — IBUPROFEN 600 MG PO TABS
600.0000 mg | ORAL_TABLET | Freq: Four times a day (QID) | ORAL | 0 refills | Status: DC
  Filled 2022-04-13: qty 30, 8d supply, fill #0

## 2022-04-13 MED ORDER — OXYCODONE-ACETAMINOPHEN 5-325 MG PO TABS: 1.0000 | ORAL_TABLET | Freq: Four times a day (QID) | ORAL | 0 refills | Status: DC | PRN

## 2022-04-13 NOTE — Discharge Summary (Signed)
Physician Discharge Summary  Patient ID: Bridget Rodriguez MRN: [id number] DOB/AGE: 44-Feb-1982 44 y.o.  Admit date: 04/12/2022 Discharge date: 04/13/2022  Admission Diagnoses:  Discharge Diagnoses:  Principal Problem:   Pelvic mass in female Active Problems:   Post-operative state   Discharged Condition: good  Hospital Course: Patient had an uncomplicated surgery; for further details of this surgery, please refer to the operative note. The patient received 2 units of packed red cells due to mildly symptomatic post op anemia. By time of discharge, her pain was controlled on oral pain medications; she was ambulating, voiding without difficulty, tolerating regular diet and passing flatus.  She was deemed stable for discharge to home.     Consults: general surgery- intraop  Significant Diagnostic Studies: labs: CBC  Treatments: IV hydration, analgesia: Toradol and acetaminophen prn Dilaudid, procedures: blood transfusion, and surgery: exp laparotomy with resection of pelivc mass.   Discharge Exam: Blood pressure 103/64, pulse 92, temperature 97.7 F (36.5 C), temperature source Oral, resp. rate 17, height '4\' 10"'$  (1.473 m), weight 74.8 kg, last menstrual period 03/27/2022, SpO2 98 %. See progress note from earlier.   Disposition: Discharge disposition: 01-Home or Self Care       Discharge Instructions     Call MD for:  difficulty breathing, headache or visual disturbances   Complete by: As directed    Call MD for:  extreme fatigue   Complete by: As directed    Call MD for:  hives   Complete by: As directed    Call MD for:  persistant dizziness or light-headedness   Complete by: As directed    Call MD for:  persistant nausea and vomiting   Complete by: As directed    Call MD for:  redness, tenderness, or signs of infection (pain, swelling, redness, odor or green/yellow discharge around incision site)   Complete by: As directed    Call MD for:  severe uncontrolled pain    Complete by: As directed    Call MD for:  temperature >100.4   Complete by: As directed    Diet - low sodium heart healthy   Complete by: As directed    Discharge wound care:   Complete by: As directed    Please remove honeycomb dressing in 1 week.   Driving Restrictions   Complete by: As directed    No driving for 2 weeks   Increase activity slowly   Complete by: As directed    Lifting restrictions   Complete by: As directed    No heavy lifting for 6 weeks.   Sexual Activity Restrictions   Complete by: As directed    No sexual activity for 6 weeks.      Allergies as of 04/13/2022   No Known Allergies      Medication List     TAKE these medications    amLODipine 2.5 MG tablet Commonly known as: NORVASC Take 1 tablet (2.5 mg total) by mouth daily.   carvedilol 12.5 MG tablet Commonly known as: COREG Take 1 tablet (12.5 mg total) by mouth 2 (two) times daily.   docusate sodium 100 MG capsule Commonly known as: COLACE Take 1 capsule (100 mg total) by mouth 2 (two) times daily.   ibuprofen 600 MG tablet Commonly known as: ADVIL Take 1 tablet (600 mg total) by mouth every 6 (six) hours.   oxyCODONE-acetaminophen 5-325 MG tablet Commonly known as: PERCOCET/ROXICET Take 1 tablet by mouth every 6 (six) hours as needed for severe pain.  valsartan 320 MG tablet Commonly known as: DIOVAN Take 1 tablet (320 mg total) by mouth daily.               Discharge Care Instructions  (From admission, onward)           Start     Ordered   04/13/22 0000  Discharge wound care:       Comments: Please remove honeycomb dressing in 1 week.   04/13/22 1052            Follow-up Information     [facility] Follow up in 1 week(s).   Contact information: [address], [place] [phone]                Signed: Lavonia Drafts 04/13/2022, 10:52 AM

## 2022-04-13 NOTE — Progress Notes (Signed)
1 Day Post-Op Procedure(s) (LRB): LAPAROTOMY WITH POSSIBLE RIGHT SALPINGO-OOPHORECTOMY (Right) CYSTOSCOPY  Subjective: Patient reports tolerating PO, + flatus, and no problems voiding.  Pt had some dizziness yesterday PRIOR to the transfusion but, feels great this am. She reports that she feels good and requests discharge to home. She has been walking the halls with no symptoms of dizziness or fatigue.   2 units of packed red blood cells transfused overnight due to acute intra-op blood loss anemia.  Foley removed this am and pt has already voided. Pt started passing pas last night. IVF stopped overnight as pt was tol liquids.    Objective: I have reviewed patient's vital signs, intake and output, medications, and labs. BP 103/64   Pulse 92   Temp 97.7 F (36.5 C) (Oral)   Resp 17   Ht '4\' 10"'$  (1.473 m)   Wt 74.8 kg   LMP 03/27/2022 (Approximate) Comment: Neg test 04/12/2022  SpO2 98%   BMI 34.49 kg/m  I/O last 3 completed shifts: In: 3825.7 [I.V.:2077; Blood:647; IV Piggyback:1101.7] Out: [id number] [Urine:3110; Blood:700] Total I/O In: 720 [P.O.:720] Out: 350 [Urine:350]   General: alert and no distress Resp: clear to auscultation bilaterally Cardio: regular rate and rhythm, S1, S2 normal, no murmur, click, rub or gallop GI: soft, non-tender; bowel sounds normal; no masses,  no organomegaly and incision: clean, dry, intact, and dressing dry.  Extremities: extremities normal, atraumatic, no cyanosis or edema Skin: good cap refill  Assessment: s/p Procedure(s): LAPAROTOMY WITH POSSIBLE RIGHT SALPINGO-OOPHORECTOMY (Right) CYSTOSCOPY: stable, progressing well, tolerating diet, and anemia  Plan: Encourage ambulation Discharge home after lunch.  Reviewed intra-op findings.  All questions answered for pt and her husband.     LOS: 1 day    Lavonia Drafts, MD 04/13/2022, 9:32 AM

## 2022-04-14 ENCOUNTER — Other Ambulatory Visit: Payer: Self-pay | Admitting: Obstetrics & Gynecology

## 2022-04-14 ENCOUNTER — Telehealth: Payer: Self-pay

## 2022-04-14 ENCOUNTER — Other Ambulatory Visit (HOSPITAL_BASED_OUTPATIENT_CLINIC_OR_DEPARTMENT_OTHER): Payer: Self-pay

## 2022-04-14 DIAGNOSIS — Z9889 Other specified postprocedural states: Secondary | ICD-10-CM

## 2022-04-14 MED ORDER — OXYCODONE-ACETAMINOPHEN 5-325 MG PO TABS
1.0000 | ORAL_TABLET | Freq: Four times a day (QID) | ORAL | 0 refills | Status: DC | PRN
  Filled 2022-04-14: qty 20, 5d supply, fill #0

## 2022-04-14 NOTE — Transitions of Care (Post Inpatient/ED Visit) (Signed)
   04/14/2022  Name: Bridget Rodriguez MRN: [id number] DOB: 07-09-1980  Today's TOC FU Call Status: Today's TOC FU Call Status:: Successful TOC FU Call Competed Unsuccessful Call (1st Attempt) Date: 04/14/22 [facility] FU Call Complete Date: 04/14/22  Transition Care Management Follow-up Telephone Call Date of Discharge: 04/13/22 Discharge Facility: [facility]) Type of Discharge: Inpatient Admission Primary Inpatient Discharge Diagnosis:: abd mass How have you been since you were released from the hospital?: Better Any questions or concerns?: No  Items Reviewed: Did you receive and understand the discharge instructions provided?: Yes Medications obtained and verified?: Yes (Medications Reviewed) Any new allergies since your discharge?: No Dietary orders reviewed?: Yes Do you have support at home?: Yes People in Home: spouse  Home Care and Equipment/Supplies: [place] Ordered?: NA Any new equipment or medical supplies ordered?: NA  Functional Questionnaire: Do you need assistance with bathing/showering or dressing?: Yes Do you need assistance with meal preparation?: No Do you need assistance with eating?: No Do you need assistance with getting out of bed/getting out of a chair/moving?: No Do you have difficulty managing or taking your medications?: No  Folllow up appointments reviewed: [facility] Follow-up appointment confirmed?: Yes Date of Specialist follow-up appointment?: 04/20/22 Follow-Up Specialty Provider:: Dr Tamala Julian Do you need transportation to your follow-up appointment?: No Do you understand care options if your condition(s) worsen?: Yes-patient verbalized understanding   [place], [place] Nurse Health Advisor Direct Dial [phone]

## 2022-04-14 NOTE — Transitions of Care (Post Inpatient/ED Visit) (Signed)
   04/14/2022  Name: Bridget Rodriguez MRN: [id number] DOB: 03/23/1980  Today's TOC FU Call Status: Today's TOC FU Call Status:: Unsuccessul Call (1st Attempt) Unsuccessful Call (1st Attempt) Date: 04/14/22  Attempted to reach the patient regarding the most recent Inpatient/ED visit.  Follow Up Plan: Additional outreach attempts will be made to reach the patient to complete the Transitions of Care (Post Inpatient/ED visit) call.   Signature Juanda Crumble, [place] Direct Dial [phone]

## 2022-04-14 NOTE — Telephone Encounter (Signed)
Patient states that the pharmacy didn't accept her paper prescription for the percocet because it was not physically signed.   Will send message to provider. Patient needs script to go to [facility] outpatient pharmacy. Kathrene Alu, RN

## 2022-04-20 ENCOUNTER — Ambulatory Visit: Payer: 59 | Admitting: Obstetrics & Gynecology

## 2022-04-20 ENCOUNTER — Encounter: Payer: Self-pay | Admitting: Obstetrics & Gynecology

## 2022-04-20 VITALS — BP 123/82 | HR 75 | Ht <= 58 in | Wt 169.0 lb

## 2022-04-20 DIAGNOSIS — Z9889 Other specified postprocedural states: Secondary | ICD-10-CM

## 2022-04-20 NOTE — Progress Notes (Signed)
Patient presents for two week postop: Laparotomy and cystoscopy. Kathrene Alu RN

## 2022-04-20 NOTE — Progress Notes (Signed)
History:  42 y.o.LMP here today for 1 week post op check. Pt is s/p exp laparotomy with resection of pelvic mass. Pt reports that she is doing well. She is eating and passing stools without difficulty.  She does report chills daily in the afternoon and in the early am. She reports that she has taken her temp and it is always less than 98 degrees. No other assoc sx. She has been taking Motrin for pain relief. She feels that her pain is as expected.  Her incision is intake and not draining.    The following portions of the patient's history were reviewed and updated as appropriate: allergies, current medications, past family history, past medical history, past social history, past surgical history and problem list.  Review of Systems:  Pertinent items are noted in HPI.    Objective:  Physical Exam BP 123/82   Pulse 75   Ht '4\' 10"'$  (1.473 m)   Wt 169 lb (76.7 kg)   LMP 03/27/2022 (Approximate) Comment: Neg test 04/12/2022  BMI 35.32 kg/m   CONSTITUTIONAL: Well-developed, well-nourished female in no acute distress.  HENT:  Normocephalic, atraumatic EYES: Conjunctivae and EOM are normal. No scleral icterus.  NECK: Normal range of motion SKIN: Skin is warm and dry. No rash noted. Not diaphoretic.No pallor. [place]: Alert and oriented to person, place, and time. Normal coordination.  Abd: Soft, nontender and nondistended; her steri strips were removed. The inscision is clean and dry. No induraiton or tenderness. It is healing well.   Pelvic: deferred  Labs and Imaging Surg path  04/12/2022 FINAL MICROSCOPIC DIAGNOSIS:   A. SOFT TISSUE MASS, RIGHT PELVIC, EXCISION:  Mature cystic teratoma.  Negative for immature or malignant features.    Assessment & Plan:  1 week post op check following s/p exp laparotomy with resection of pelvic mass. Pt has chills daily. I am not clear on the origin of this. Will get a CBC. Pt agrees that she does not feel ill and will call ASAP if her sx change.    CBC  and [place]  Reviewed her surg path.   Reviewed post op instructions and activities  Gradual increase in activities  F/u in 3 weeks or sooner prn  Reviewed no intercourse for 8 weeks post op  All questions answered.   Nalani Andreen L. Harraway-Smith, M.D., [place]

## 2022-04-21 ENCOUNTER — Telehealth: Payer: Self-pay

## 2022-04-21 LAB — CBC
Hematocrit: 33.4 % — ABNORMAL LOW (ref 34.0–46.6)
Hemoglobin: 10 g/dL — ABNORMAL LOW (ref 11.1–15.9)
MCH: 22 pg — ABNORMAL LOW (ref 26.6–33.0)
MCHC: 29.9 g/dL — ABNORMAL LOW (ref 31.5–35.7)
MCV: 73 fL — ABNORMAL LOW (ref 79–97)
Platelets: 398 10*3/uL (ref 150–450)
RBC: 4.55 x10E6/uL (ref 3.77–5.28)
RDW: 21.3 % — ABNORMAL HIGH (ref 11.7–15.4)
WBC: 11.2 10*3/uL — ABNORMAL HIGH (ref 3.4–10.8)

## 2022-04-21 LAB — FOLLICLE STIMULATING HORMONE: FSH: 5.4 m[IU]/mL

## 2022-04-21 NOTE — Telephone Encounter (Signed)
Patient called stating her wound is draining clear fluid. She denies having a fever and pain. She states she has been having chills x 3 days.Patient made aware that the clear drainage is normal.Advised patient to call the office back if she starts to run a fever or if the color of the drainage changes or if she notices an odor from her wound. Understanding was voiced. Keilin Gamboa l Benedicto Capozzi, CMA

## 2022-04-22 ENCOUNTER — Encounter: Payer: Self-pay | Admitting: Obstetrics and Gynecology

## 2022-04-22 ENCOUNTER — Ambulatory Visit (INDEPENDENT_AMBULATORY_CARE_PROVIDER_SITE_OTHER): Payer: 59 | Admitting: Obstetrics and Gynecology

## 2022-04-22 VITALS — BP 110/68 | HR 65 | Wt 170.0 lb

## 2022-04-22 DIAGNOSIS — T8131XA Disruption of external operation (surgical) wound, not elsewhere classified, initial encounter: Secondary | ICD-10-CM

## 2022-04-22 DIAGNOSIS — T8131XS Disruption of external operation (surgical) wound, not elsewhere classified, sequela: Secondary | ICD-10-CM

## 2022-04-22 NOTE — Progress Notes (Signed)
    GYNECOLOGY VISIT  Patient name: Bridget Rodriguez MRN [id number]  Date of birth: 06-Dec-1980 Chief Complaint:   Wound Check  History:  Bridget Rodriguez is a 42 y.o. G2P2002 being seen today for wound concern. S/p open mass resection of teratoma with cystoscopy presenting with wound opening. States it had been draining and then after steri strips removed there was increased drainage and further separation of the skin. No fever.   Review of Systems:  Pertinent items are noted in HPI. Comprehensive review of systems was otherwise negative.   Objective:  Physical Exam BP 110/68   Pulse 65   Wt 170 lb (77.1 kg)   LMP 03/27/2022 (Approximate) Comment: Neg test 04/12/2022  BMI 35.53 kg/m    Physical Exam Vitals and nursing note reviewed.  Constitutional:      Appearance: Normal appearance.  HENT:     Head: Normocephalic and atraumatic.  Pulmonary:     Effort: Pulmonary effort is normal.  Abdominal:     Palpations: Abdomen is soft.     Comments: ~1cm area of skin separation on right side of incision able to be probed nearly 1cm deep, 1cm to the left and 0.5cm to the right, superiorly and inferiorly. Small volume of serous fluid. Fascia intact and granulation tissue noted on skin incision  After the incision was probed, the tissue was cleaned with sterile water. The defect was packed with iodoform dressing.   Skin:    General: Skin is warm and dry.  Neurological:     General: No focal deficit present.     Mental Status: She is alert.  Psychiatric:        Mood and Affect: Mood normal.        Behavior: Behavior normal.        Thought Content: Thought content normal.        Judgment: Judgment normal.      Media Information    Assessment & Plan:   1. Wound disruption, post-op, skin, sequela Now s/p uncomplicated wound clearning and packing with iodoform packing. Will have her return on Monday for wound check and repacking.     Darliss Cheney, MD Minimally Invasive  [facility] [place]

## 2022-04-25 ENCOUNTER — Ambulatory Visit (INDEPENDENT_AMBULATORY_CARE_PROVIDER_SITE_OTHER): Payer: 59 | Admitting: Obstetrics and Gynecology

## 2022-04-25 ENCOUNTER — Encounter: Payer: Self-pay | Admitting: Obstetrics and Gynecology

## 2022-04-25 VITALS — BP 128/89 | HR 81 | Wt 168.0 lb

## 2022-04-25 DIAGNOSIS — T8131XS Disruption of external operation (surgical) wound, not elsewhere classified, sequela: Secondary | ICD-10-CM

## 2022-04-25 NOTE — Progress Notes (Signed)
    GYNECOLOGY VISIT  Patient name: ELMO BOLDS MRN [id number]  Date of birth: Jul 03, 1980 Chief Complaint:   Dressing Change  History:  Bridget Rodriguez is a 42 y.o. G2P2002 being seen today for wound check. Feels slight burning sensation around the right side of the incision. Drainage initially yellow. Overall states she doesn't feel well and worried if she will be able to go back to work due to incisional pain/discomfort.    The following portions of the patient's history were reviewed and updated as appropriate: allergies, current medications, past family history, past medical history, past social history, past surgical history and problem list.     Review of Systems:  Pertinent items are noted in HPI. Comprehensive review of systems was otherwise negative.   Objective:  Physical Exam BP 128/89   Pulse 81   Wt 168 lb (76.2 kg)   LMP 03/27/2022 (Approximate) Comment: Neg test 04/12/2022  BMI 35.11 kg/m    Physical Exam Vitals and nursing note reviewed.  Constitutional:      Appearance: Normal appearance.  HENT:     Head: Normocephalic and atraumatic.  Pulmonary:     Effort: Pulmonary effort is normal.  Abdominal:     Comments: ~1 cm opening, fascia intact on probing w/ decreased lateral defect  Skin:    General: Skin is warm and dry.  Neurological:     General: No focal deficit present.     Mental Status: She is alert.  Psychiatric:        Mood and Affect: Mood normal.        Behavior: Behavior normal.        Thought Content: Thought content normal.        Judgment: Judgment normal.       Assessment & Plan:  1. Wound disruption, post-op, skin, sequela Repeat CBC today due to report of not feeling well. Incision cleaned with sterile water and repacked. Will have her return Wednesday for wound packing. Reassurance provided.  - CBC  Darliss Cheney, MD Minimally Invasive [facility] [place]

## 2022-04-26 LAB — CBC
Hematocrit: 36.4 % (ref 34.0–46.6)
Hemoglobin: 10.9 g/dL — ABNORMAL LOW (ref 11.1–15.9)
MCH: 21.9 pg — ABNORMAL LOW (ref 26.6–33.0)
MCHC: 29.9 g/dL — ABNORMAL LOW (ref 31.5–35.7)
MCV: 73 fL — ABNORMAL LOW (ref 79–97)
Platelets: 445 10*3/uL (ref 150–450)
RBC: 4.97 x10E6/uL (ref 3.77–5.28)
RDW: 21.8 % — ABNORMAL HIGH (ref 11.7–15.4)
WBC: 12.2 10*3/uL — ABNORMAL HIGH (ref 3.4–10.8)

## 2022-04-27 ENCOUNTER — Encounter: Payer: 59 | Admitting: Obstetrics & Gynecology

## 2022-04-27 ENCOUNTER — Encounter: Payer: Self-pay | Admitting: Obstetrics and Gynecology

## 2022-04-27 ENCOUNTER — Ambulatory Visit (INDEPENDENT_AMBULATORY_CARE_PROVIDER_SITE_OTHER): Payer: 59 | Admitting: Obstetrics and Gynecology

## 2022-04-27 VITALS — BP 120/74 | HR 73 | Temp 98.4°F | Wt 170.0 lb

## 2022-04-27 DIAGNOSIS — T8131XS Disruption of external operation (surgical) wound, not elsewhere classified, sequela: Secondary | ICD-10-CM

## 2022-04-27 NOTE — Progress Notes (Signed)
    GYNECOLOGY VISIT  Patient name: Bridget Rodriguez MRN [id number]  Date of birth: [birth year] Chief Complaint:   Dressing Change  History:  Bridget Rodriguez is a 42 y.o. G2P2002 being seen today for wound dressing change.  Feeling better overall - incision healing more. Having drainage and changed dressing at home. 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    Review of Systems:  Pertinent items are noted in HPI. Comprehensive review of systems was otherwise negative.   Objective:  Physical Exam BP 120/74   Pulse 73   Temp 98.4 F (36.9 C)   Wt 170 lb (77.1 kg)   LMP 03/27/2022 (Approximate) Comment: Neg test 04/12/2022  BMI 35.53 kg/m    Physical Exam Vitals and nursing note reviewed.  Constitutional:      Appearance: Normal appearance.  Pulmonary:     Effort: Pulmonary effort is normal.  Abdominal:     Comments: ~1cm opening on right side of low transverse incision, mild induration and slight erythema though not warm to touch and minimally tender Able to probe incision to ~1cm depth, decreased lateral probing        Assessment & Plan:   1. Wound disruption, post-op, skin, sequela Old packing removed, incision cleaned with sterile water and repacked with iodoform packing. Area on left lateral incision treated with silver nitrate. Patient will do dressing changes daily at home for the next week. Provided with iodoform packing and instructions on how to change. Will return in 1 week for wound check. CBC along baseline - all cell lines slightly elevated, likely mild dehydration. Patient clinically much  improved.   Darliss Cheney, MD Minimally Invasive [facility] [place]

## 2022-04-29 ENCOUNTER — Encounter: Payer: 59 | Admitting: Obstetrics and Gynecology

## 2022-05-02 ENCOUNTER — Ambulatory Visit: Payer: 59 | Admitting: Family Medicine

## 2022-05-04 ENCOUNTER — Encounter: Payer: Self-pay | Admitting: Family Medicine

## 2022-05-04 ENCOUNTER — Ambulatory Visit (INDEPENDENT_AMBULATORY_CARE_PROVIDER_SITE_OTHER): Payer: 59 | Admitting: Family Medicine

## 2022-05-04 VITALS — BP 152/94 | HR 78 | Resp 16 | Wt 169.0 lb

## 2022-05-04 DIAGNOSIS — T8131XS Disruption of external operation (surgical) wound, not elsewhere classified, sequela: Secondary | ICD-10-CM

## 2022-05-04 DIAGNOSIS — T8131XA Disruption of external operation (surgical) wound, not elsewhere classified, initial encounter: Secondary | ICD-10-CM | POA: Diagnosis not present

## 2022-05-04 NOTE — Progress Notes (Signed)
   Subjective:    Patient ID: Bridget Rodriguez, female    DOB: 29-Dec-1980, 42 y.o.   MRN: [id number]  HPI  Patient seen for wound recheck.  She had a opening in her abdominal incision mouth 1 cm in length.  She has been packing up to iodoform ribbon.  Her appetite is improving.  No fevers or chills.  Review of Systems     Objective:   Physical Exam Vitals reviewed.  Constitutional:      Appearance: Normal appearance.  Abdominal:     Comments: Defect abdominal wound about 1 cm in length.  The wound was probed and is about 6 mm in depth with 6 mm slight tunneling.  Neurological:     Mental Status: She is alert.  Psychiatric:        Mood and Affect: Mood normal.        Behavior: Behavior normal.        Thought Content: Thought content normal.        Judgment: Judgment normal.           Assessment & Plan:  1. Wound disruption, post-op, skin, sequela Wound appears to improving with granulation tissue. No erythema or purulent drainage. Continue packing at home and revisit in 1 week.

## 2022-05-11 ENCOUNTER — Encounter: Payer: Self-pay | Admitting: Obstetrics & Gynecology

## 2022-05-11 ENCOUNTER — Ambulatory Visit (INDEPENDENT_AMBULATORY_CARE_PROVIDER_SITE_OTHER): Payer: 59 | Admitting: Obstetrics & Gynecology

## 2022-05-11 VITALS — BP 132/85 | HR 67 | Wt 169.0 lb

## 2022-05-11 DIAGNOSIS — S301XXS Contusion of abdominal wall, sequela: Secondary | ICD-10-CM

## 2022-05-11 DIAGNOSIS — Z9889 Other specified postprocedural states: Secondary | ICD-10-CM

## 2022-05-22 ENCOUNTER — Encounter: Payer: Self-pay | Admitting: Obstetrics & Gynecology

## 2022-05-22 NOTE — Progress Notes (Signed)
History:  42 y.o.LMP here today for 2 week post op check.Pt is s/p LAPAROTOMY WITH POSSIBLE RIGHT SALPINGO-OOPHORECTOMY (Right) CYSTOSCOPY on 04/12/2022.  Post op she developed a wound seroma which has been managed by my partners. Pt reports that she has several days of not feeling well which has completely resolved at present. She reports that her pain is well managed now with only OTC meds and she is getting around well.  Her incision is not draining. She also denies feer or chills or any ontoward sx.  She reports that the care that she received as been good.    The following portions of the patient's history were reviewed and updated as appropriate: allergies, current medications, past family history, past medical history, past social history, past surgical history and problem list.  Review of Systems:  Pertinent items are noted in HPI.    Objective:  Physical Exam BP 132/85   Pulse 67   Wt 169 lb (76.7 kg)   LMP 03/27/2022 (Approximate) Comment: Neg test 04/12/2022  BMI 35.32 kg/m   CONSTITUTIONAL: Well-developed, well-nourished female in no acute distress.  HENT:  Normocephalic, atraumatic EYES: Conjunctivae and EOM are normal. No scleral icterus.  NECK: Normal range of motion SKIN: Skin is warm and dry. No rash noted. Not diaphoretic.No pallor. NEUROLGIC: Alert and oriented to person, place, and time. Normal coordination.  Abd: Soft, nontender and nondistended; her incision is heaiing well.  There is one area on the right side where the skin edges are open but, there is no depth. A q-tip used to probe the lesion showed that it was only the skin edges that were still open. There were no s/sx of infxn.   Pelvic: deferred   Assessment & Plan:  4 week post op check following LAPAROTOMY WITH POSSIBLE RIGHT SALPINGO-OOPHORECTOMY (Right) with CYSTOSCOPY. Pt is s/p wound seroma. She is now without complaints.      Doing well  Reviewed her surg path.   Reviewed post op instructions and  activities  Gradual increase in activities  F/u in 2 weeks or sooner prn  Reviewed no intercourse for 8 weeks post op  Pt to RTW after 6 weeks post op   All questions answered.   Thorn Demas L. Harraway-Smith, M.D., [place]

## 2022-05-25 ENCOUNTER — Encounter: Payer: Self-pay | Admitting: Obstetrics & Gynecology

## 2022-05-25 ENCOUNTER — Ambulatory Visit (INDEPENDENT_AMBULATORY_CARE_PROVIDER_SITE_OTHER): Payer: 59 | Admitting: Obstetrics & Gynecology

## 2022-05-25 VITALS — BP 123/76 | HR 68 | Wt 169.0 lb

## 2022-05-25 DIAGNOSIS — Z9889 Other specified postprocedural states: Secondary | ICD-10-CM

## 2022-05-25 NOTE — Progress Notes (Signed)
History:  42 y.o.LMP here today for 6 week post op check.Pt is s/p laparotomy with right ovarian cystectomy for large dermoid cyst. Pt reports that she is doing well at present. She is eating and passing stools without difficulty.  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   The following portions of the patient's history were reviewed and updated as appropriate: allergies, current medications, past family history, past medical history, past social history, past surgical history and problem list.  Review of Systems:  Pertinent items are noted in HPI.    Objective:  Physical Exam BP 123/76   Pulse 68   Wt 169 lb (76.7 kg)   LMP 04/28/2022 (Exact Date)   BMI 35.32 kg/m   CONSTITUTIONAL: Well-developed, well-nourished female in no acute distress.  HENT:  Normocephalic, atraumatic EYES: Conjunctivae and EOM are normal. No scleral icterus.  NECK: Normal range of motion SKIN: Skin is warm and dry. No rash noted. Not diaphoretic.No pallor. NEUROLGIC: Alert and oriented to person, place, and time. Normal coordination.  Abd: Soft, nontender and nondistended; her port sites are healing well. .  Pelvic: deferred   Assessment & Plan:  6 week post op check.Pt is s/p laparotomy with right ovarian cystectomy for large dermoid cyst RTW today   Doing well  Reviewed post op instructions and activities  Gradual increase in activities  F/u in 3months or sooner prn  All questions answered.   Deysy Schabel L. Harraway-Smith, M.D., [place]

## 2022-05-31 ENCOUNTER — Other Ambulatory Visit (HOSPITAL_BASED_OUTPATIENT_CLINIC_OR_DEPARTMENT_OTHER): Payer: Self-pay

## 2022-05-31 ENCOUNTER — Other Ambulatory Visit: Payer: Self-pay | Admitting: Family

## 2022-05-31 MED ORDER — AMLODIPINE BESYLATE 2.5 MG PO TABS
2.5000 mg | ORAL_TABLET | Freq: Every day | ORAL | 0 refills | Status: DC
  Filled 2022-05-31 – 2022-06-10 (×2): qty 90, 90d supply, fill #0

## 2022-05-31 MED ORDER — CARVEDILOL 12.5 MG PO TABS
12.5000 mg | ORAL_TABLET | Freq: Two times a day (BID) | ORAL | 0 refills | Status: DC
  Filled 2022-05-31 – 2022-06-10 (×2): qty 180, 90d supply, fill #0

## 2022-06-02 ENCOUNTER — Encounter: Payer: Self-pay | Admitting: Obstetrics & Gynecology

## 2022-06-09 ENCOUNTER — Other Ambulatory Visit (HOSPITAL_BASED_OUTPATIENT_CLINIC_OR_DEPARTMENT_OTHER): Payer: Self-pay

## 2022-06-10 ENCOUNTER — Other Ambulatory Visit (HOSPITAL_BASED_OUTPATIENT_CLINIC_OR_DEPARTMENT_OTHER): Payer: Self-pay

## 2022-08-02 ENCOUNTER — Other Ambulatory Visit: Payer: Self-pay | Admitting: Family

## 2022-08-02 ENCOUNTER — Other Ambulatory Visit (HOSPITAL_BASED_OUTPATIENT_CLINIC_OR_DEPARTMENT_OTHER): Payer: Self-pay

## 2022-08-02 MED ORDER — AMLODIPINE BESYLATE 2.5 MG PO TABS
2.5000 mg | ORAL_TABLET | Freq: Every day | ORAL | 0 refills | Status: DC
  Filled 2022-08-02 – 2022-09-14 (×6): qty 90, 90d supply, fill #0

## 2022-08-02 MED ORDER — CARVEDILOL 12.5 MG PO TABS
12.5000 mg | ORAL_TABLET | Freq: Two times a day (BID) | ORAL | 0 refills | Status: DC
  Filled 2022-08-02 – 2022-09-14 (×6): qty 180, 90d supply, fill #0

## 2022-08-02 MED ORDER — VALSARTAN 320 MG PO TABS
320.0000 mg | ORAL_TABLET | Freq: Every day | ORAL | 1 refills | Status: DC
  Filled 2022-08-02 – 2022-08-04 (×2): qty 90, 90d supply, fill #0
  Filled 2022-11-02: qty 90, 90d supply, fill #1

## 2022-08-04 ENCOUNTER — Other Ambulatory Visit: Payer: Self-pay

## 2022-08-04 ENCOUNTER — Other Ambulatory Visit (HOSPITAL_BASED_OUTPATIENT_CLINIC_OR_DEPARTMENT_OTHER): Payer: Self-pay

## 2022-08-08 ENCOUNTER — Other Ambulatory Visit (HOSPITAL_BASED_OUTPATIENT_CLINIC_OR_DEPARTMENT_OTHER): Payer: Self-pay

## 2022-08-08 ENCOUNTER — Other Ambulatory Visit: Payer: Self-pay

## 2022-08-09 ENCOUNTER — Other Ambulatory Visit (HOSPITAL_BASED_OUTPATIENT_CLINIC_OR_DEPARTMENT_OTHER): Payer: Self-pay

## 2022-08-09 MED ORDER — AMOXICILLIN 500 MG PO CAPS
500.0000 mg | ORAL_CAPSULE | Freq: Three times a day (TID) | ORAL | 0 refills | Status: DC
  Filled 2022-08-09: qty 21, 7d supply, fill #0

## 2022-08-22 ENCOUNTER — Other Ambulatory Visit (HOSPITAL_BASED_OUTPATIENT_CLINIC_OR_DEPARTMENT_OTHER): Payer: Self-pay

## 2022-08-31 ENCOUNTER — Other Ambulatory Visit (HOSPITAL_BASED_OUTPATIENT_CLINIC_OR_DEPARTMENT_OTHER): Payer: Self-pay

## 2022-09-13 ENCOUNTER — Other Ambulatory Visit (HOSPITAL_BASED_OUTPATIENT_CLINIC_OR_DEPARTMENT_OTHER): Payer: Self-pay

## 2022-09-14 ENCOUNTER — Other Ambulatory Visit (HOSPITAL_BASED_OUTPATIENT_CLINIC_OR_DEPARTMENT_OTHER): Payer: Self-pay

## 2023-01-10 ENCOUNTER — Ambulatory Visit: Payer: 59 | Admitting: Family

## 2023-01-10 ENCOUNTER — Encounter: Payer: Self-pay | Admitting: Family

## 2023-01-10 ENCOUNTER — Telehealth (HOSPITAL_BASED_OUTPATIENT_CLINIC_OR_DEPARTMENT_OTHER): Payer: Self-pay

## 2023-01-10 VITALS — BP 150/100 | HR 60 | Temp 98.5°F | Resp 16 | Ht <= 58 in | Wt 163.0 lb

## 2023-01-10 DIAGNOSIS — R1900 Intra-abdominal and pelvic swelling, mass and lump, unspecified site: Secondary | ICD-10-CM

## 2023-01-10 DIAGNOSIS — D649 Anemia, unspecified: Secondary | ICD-10-CM

## 2023-01-10 DIAGNOSIS — Z0000 Encounter for general adult medical examination without abnormal findings: Secondary | ICD-10-CM

## 2023-01-10 DIAGNOSIS — E66811 Obesity, class 1: Secondary | ICD-10-CM

## 2023-01-10 DIAGNOSIS — Z1231 Encounter for screening mammogram for malignant neoplasm of breast: Secondary | ICD-10-CM

## 2023-01-10 DIAGNOSIS — E785 Hyperlipidemia, unspecified: Secondary | ICD-10-CM | POA: Diagnosis not present

## 2023-01-10 DIAGNOSIS — I10 Essential (primary) hypertension: Secondary | ICD-10-CM | POA: Diagnosis not present

## 2023-01-10 NOTE — Assessment & Plan Note (Signed)
S/p resection- improvement in pelvica and back pain following excision. Path showed mature cystic teratoma.

## 2023-01-10 NOTE — Patient Instructions (Signed)
VISIT SUMMARY:  During your routine check-up, we discussed your blood pressure management, weight loss progress, post-operative recovery, and preventive health measures. You reported generally good blood pressure readings at home, successful weight loss, and significant improvement in your overall health and mobility since your surgery. We also addressed some swelling in your legs, which has improved with medication adjustment.  YOUR PLAN:  -HYPERTENSION: Hypertension, or high blood pressure, is when the force of the blood against your artery walls is too high. You are currently taking Amlodipine 2.5mg  and Carvedilol 12.5mg  twice daily. Please continue to monitor your blood pressure at home and send your readings to [place]. We will recheck your blood pressure in the office in 3 months.  -WEIGHT MANAGEMENT: You have successfully lost 6 pounds since your last visit by making healthier diet and exercise choices. Please continue with these positive lifestyle changes.  -POST-OPERATIVE RECOVERY: You have experienced significant improvement in your symptoms and overall health following the removal of a large benign mass. No further action is required at this time.  -PREVENTIVE HEALTH: You are up to date on your Pap smear and flu shot. We have ordered blood tests including a blood count, metabolic panel, and cholesterol panel. Additionally, due to the presence of calcium oxalate crystals in your urine, it is important to stay well-hydrated to reduce the risk of kidney stones.  INSTRUCTIONS:  Please continue to monitor your blood pressure at home and send your readings to [place]. We will recheck your blood pressure in the office in 3 months. Stay well-hydrated to reduce the risk of kidney stones.

## 2023-01-10 NOTE — Assessment & Plan Note (Addendum)
  Elevated blood pressure in office, but patient reports lower readings at home. Currently on Amlodipine 2.5mg  and Carvedilol 12.5mg  twice daily and valsartan . - Patient to check blood pressure at home and send reading to provider later this evening. Recheck blood pressure in office in 3 months.

## 2023-01-10 NOTE — Assessment & Plan Note (Signed)
  Patient reports weight loss of 6 pounds since last visit and is making efforts towards a healthier diet and increased physical activity. - Encourage continuation of healthy lifestyle changes.

## 2023-01-10 NOTE — Progress Notes (Signed)
\  Subjective:     Patient ID: Bridget Rodriguez, female    DOB: 1980/06/20, 42 y.o.   MRN: [id number]  Chief Complaint  Patient presents with   Annual Exam    HPI  Discussed the use of AI scribe software for clinical note transcription with the patient, who gave verbal consent to proceed.  History of Present Illness   The patient, with a history of hypertension managed with amlodipine 2.5mg  and carvedilol 12.5mg  twice daily, presents for a routine check-up. She reports her blood pressure at home is generally around 130/80, with the highest reading being 132 and the lowest 118/60. She admits to struggling with maintaining a healthy diet and exercise routine, but has managed to lose 6 pounds since her last visit.  The patient also reports a significant improvement in her overall health and mobility since having a large, benign mass removed. She describes the mass as having caused significant pressure and back pain, which has since resolved. She is now more active, particularly at work, and has noticed a significant reduction in feelings of nausea.  The patient also mentions some swelling in her legs, which she attributes to amlodipine. She reports that the swelling has improved since the dosage was decreased.        Wt Readings from Last 3 Encounters:  01/10/23 163 lb (73.9 kg)  05/25/22 169 lb (76.7 kg)  05/11/22 169 lb (76.7 kg)      Health Maintenance Due  Topic Date Due   COVID-19 Vaccine (4 - 2023-24 season) 10/09/2022    Past Medical History:  Diagnosis Date   Eczema 10/28/2016   Essential hypertension 10/28/2016   Fatigue 06/26/2020   Fatty liver    Microcytosis 06/26/2020   Pelvic mass in female 09/30/2020   Preventative health care 12/23/2020    Past Surgical History:  Procedure Laterality Date   CESAREAN SECTION  2009   CESAREAN SECTION WITH BILATERAL TUBAL LIGATION Bilateral 2017   CYSTOSCOPY  04/12/2022   Procedure: CYSTOSCOPY;  Surgeon: Willodean Rosenthal, MD;  Location: [facility] OR;  Service: Gynecology;;   DIAGNOSTIC LAPAROSCOPY     LAPAROTOMY Right 04/12/2022   Procedure: LAPAROTOMY WITH POSSIBLE RIGHT SALPINGO-OOPHORECTOMY;  Surgeon: Willodean Rosenthal, MD;  Location: [facility] OR;  Service: Gynecology;  Laterality: Right;    Family History  Problem Relation Age of Onset   Hypertension Father     Social History   Socioeconomic History   Marital status: Married    Spouse name: Rande Lawman   Number of children: Not on file   Years of education: Not on file   Highest education level: Not on file  Occupational History   Not on file  Tobacco Use   Smoking status: Never   Smokeless tobacco: Never  Vaping Use   Vaping status: Never Used  Substance and Sexual Activity   Alcohol use: No   Drug use: No   Sexual activity: Yes    Birth control/protection: Surgical  Other Topics Concern   Not on file  Social History Narrative   2 children    2017- daughter- Grover Canavan   2009- son- Caryn Bee   Married   Works as an Charity fundraiser at [employer]    Enjoys restaurants, visiting local sites   Parents and live locally.    Social Determinants of Health   Financial Resource Strain: Not on file  Food Insecurity: No Food Insecurity (04/12/2022)   Hunger Vital Sign    Worried About Running Out of Food in  the Last Year: Never true    Ran Out of Food in the Last Year: Never true  Transportation Needs: No Transportation Needs (04/12/2022)   PRAPARE - Administrator, Civil Service (Medical): No    Lack of Transportation (Non-Medical): No  Physical Activity: Not on file  Stress: Not on file  Social Connections: Not on file  Intimate Partner Violence: Not At Risk (04/12/2022)   Humiliation, Afraid, Rape, and Kick questionnaire    Fear of Current or Ex-Partner: No    Emotionally Abused: No    Physically Abused: No    Sexually Abused: No    Outpatient Medications Prior to Visit  Medication Sig Dispense Refill   amLODipine (NORVASC) 2.5 MG  tablet Take 1 tablet (2.5 mg total) by mouth daily. 90 tablet 0   carvedilol (COREG) 12.5 MG tablet Take 1 tablet (12.5 mg total) by mouth 2 (two) times daily. 180 tablet 0   valsartan (DIOVAN) 320 MG tablet Take 1 tablet (320 mg total) by mouth daily. 90 tablet 1   amoxicillin (AMOXIL) 500 MG capsule Take 1 tablet (500 mg total) by mouth 3 (three) times daily with food for 7 days. Finish full script. 21 capsule 0   docusate sodium (COLACE) 100 MG capsule Take 1 capsule (100 mg total) by mouth 2 (two) times daily. 100 capsule 0   ibuprofen (ADVIL) 600 MG tablet Take 1 tablet (600 mg total) by mouth every 6 (six) hours. (Patient not taking: Reported on 05/25/2022) 30 tablet 0   oxyCODONE-acetaminophen (PERCOCET/ROXICET) 5-325 MG tablet Take 1 tablet by mouth every 6 (six) hours as needed for severe pain. (Patient not taking: Reported on 05/25/2022) 20 tablet 0   No facility-administered medications prior to visit.    No Known Allergies  Review of Systems  Constitutional:  Positive for weight loss.  HENT:  Negative for congestion and hearing loss.   Eyes:  Negative for blurred vision.  Respiratory:  Negative for cough.   Cardiovascular:  Positive for leg swelling (mild due to amlodipine).  Gastrointestinal:  Negative for constipation and diarrhea.  Genitourinary:  Negative for dysuria and frequency.  Musculoskeletal:  Negative for joint pain and myalgias.  Skin:  Negative for rash.  Neurological:  Negative for headaches.  Psychiatric/Behavioral:         Denies depression/anxiety       Objective:    Physical Exam   BP (!) 150/100   Pulse 60   Temp 98.5 F (36.9 C) (Oral)   Resp 16   Ht 4\' 10"  (1.473 m)   Wt 163 lb (73.9 kg)   SpO2 100%   BMI 34.07 kg/m  Wt Readings from Last 3 Encounters:  01/10/23 163 lb (73.9 kg)  05/25/22 169 lb (76.7 kg)  05/11/22 169 lb (76.7 kg)   Physical Exam  Constitutional: She is oriented to person, place, and time. She appears well-developed  and well-nourished. No distress.  HENT:  Head: Normocephalic and atraumatic.  Right Ear: Tympanic membrane and ear canal normal.  Left Ear: Tympanic membrane and ear canal normal.  Mouth/Throat: Oropharynx is clear and moist.  Eyes: Pupils are equal, round, and reactive to light. No scleral icterus.  Neck: Normal range of motion. No thyromegaly present.  Cardiovascular: Normal rate and regular rhythm.   No murmur heard. Pulmonary/Chest: Effort normal and breath sounds normal. No respiratory distress. He has no wheezes. She has no rales. She exhibits no tenderness.  Abdominal: Soft. Bowel sounds are normal. She exhibits  no distension and no mass. There is no tenderness. There is no rebound and no guarding.  Musculoskeletal: She exhibits no edema.  Lymphadenopathy:    She has no cervical adenopathy.  Neurological: She is alert and oriented to person, place, and time. She has normal patellar reflexes. She exhibits normal muscle tone. Coordination normal.  Skin: Skin is warm and dry.  Psychiatric: She has a normal mood and affect. Her behavior is normal. Judgment and thought content normal.  Breast/pelvic: deferred        Assessment & Plan:       Assessment & Plan:   Problem List Items Addressed This Visit       Unprioritized   Preventative health care     Preventive Health Up to date on Pap smear and flu shot. - Order blood count, metabolic panel, and cholesterol panel. - Advise patient on importance of hydration due to presence of calcium oxalate crystals in urine, indicating increased risk for kidney stones. Plan to begin colonoscopies next year.       Obesity (BMI 30.0-34.9)     Patient reports weight loss of 6 pounds since last visit and is making efforts towards a healthier diet and increased physical activity. - Encourage continuation of healthy lifestyle changes.       Essential hypertension     Elevated blood pressure in office, but patient reports lower readings at  home. Currently on Amlodipine 2.5mg  and Carvedilol 12.5mg  twice daily. - Patient to check blood pressure at home and send reading to provider. - Recheck blood pressure in office in 3 months.       Relevant Orders   Comp Met (CMET)   Other Visit Diagnoses     Hyperlipidemia, unspecified hyperlipidemia type    -  Primary   Relevant Orders   Lipid panel   Anemia, unspecified type       Relevant Orders   CBC w/Diff   Breast cancer screening by mammogram       Relevant Orders   MM 3D SCREENING MAMMOGRAM BILATERAL BREAST       I have discontinued Rayana D. Duerr's ibuprofen, docusate sodium, oxyCODONE-acetaminophen, and amoxicillin. I am also having her maintain her valsartan, amLODipine, and carvedilol.  No orders of the defined types were placed in this encounter.

## 2023-01-10 NOTE — Assessment & Plan Note (Addendum)
  Preventive Health Up to date on Pap smear and flu shot. - Order blood count, metabolic panel, and cholesterol panel. - Advise patient on importance of hydration due to presence of calcium oxalate crystals in urine, indicating increased risk for kidney stones. Plan to begin colonoscopies next year.

## 2023-01-11 LAB — CBC WITH DIFFERENTIAL/PLATELET
Basophils Absolute: 0.1 10*3/uL (ref 0.0–0.1)
Basophils Relative: 1 % (ref 0.0–3.0)
Eosinophils Absolute: 0.2 10*3/uL (ref 0.0–0.7)
Eosinophils Relative: 2.1 % (ref 0.0–5.0)
HCT: 41.1 % (ref 36.0–46.0)
Hemoglobin: 12.7 g/dL (ref 12.0–15.0)
Lymphocytes Relative: 24.7 % (ref 12.0–46.0)
Lymphs Abs: 2 10*3/uL (ref 0.7–4.0)
MCHC: 30.9 g/dL (ref 30.0–36.0)
MCV: 65.6 fL — ABNORMAL LOW (ref 78.0–100.0)
Monocytes Absolute: 0.3 10*3/uL (ref 0.1–1.0)
Monocytes Relative: 4 % (ref 3.0–12.0)
Neutro Abs: 5.6 10*3/uL (ref 1.4–7.7)
Neutrophils Relative %: 68.2 % (ref 43.0–77.0)
Platelets: 334 10*3/uL (ref 150.0–400.0)
RBC: 6.26 Mil/uL — ABNORMAL HIGH (ref 3.87–5.11)
RDW: 15.1 % (ref 11.5–15.5)
WBC: 8.2 10*3/uL (ref 4.0–10.5)

## 2023-01-11 LAB — COMPREHENSIVE METABOLIC PANEL
ALT: 37 U/L — ABNORMAL HIGH (ref 0–35)
AST: 24 U/L (ref 0–37)
Albumin: 4.3 g/dL (ref 3.5–5.2)
Alkaline Phosphatase: 94 U/L (ref 39–117)
BUN: 15 mg/dL (ref 6–23)
CO2: 26 meq/L (ref 19–32)
Calcium: 9.5 mg/dL (ref 8.4–10.5)
Chloride: 103 meq/L (ref 96–112)
Creatinine, Ser: 0.64 mg/dL (ref 0.40–1.20)
GFR: 107.32 mL/min (ref >60.00)
Glucose, Bld: 92 mg/dL (ref 70–99)
Potassium: 3.7 meq/L (ref 3.5–5.1)
Sodium: 137 meq/L (ref 135–145)
Total Bilirubin: 0.5 mg/dL (ref 0.2–1.2)
Total Protein: 7.4 g/dL (ref 6.0–8.3)

## 2023-01-11 LAB — LIPID PANEL
Cholesterol: 215 mg/dL — ABNORMAL HIGH (ref 0–200)
HDL: 55.1 mg/dL (ref >39.00)
LDL Cholesterol: 110 mg/dL — ABNORMAL HIGH (ref 0–99)
NonHDL: 160.36
Total CHOL/HDL Ratio: 4
Triglycerides: 252 mg/dL — ABNORMAL HIGH (ref 0.0–149.0)
VLDL: 50.4 mg/dL — ABNORMAL HIGH (ref 0.0–40.0)

## 2023-01-18 ENCOUNTER — Telehealth (HOSPITAL_BASED_OUTPATIENT_CLINIC_OR_DEPARTMENT_OTHER): Payer: Self-pay

## 2023-01-24 ENCOUNTER — Telehealth (HOSPITAL_BASED_OUTPATIENT_CLINIC_OR_DEPARTMENT_OTHER): Payer: Self-pay | Admitting: Family

## 2023-01-26 ENCOUNTER — Encounter (HOSPITAL_BASED_OUTPATIENT_CLINIC_OR_DEPARTMENT_OTHER): Payer: Self-pay

## 2023-01-26 ENCOUNTER — Ambulatory Visit (HOSPITAL_BASED_OUTPATIENT_CLINIC_OR_DEPARTMENT_OTHER)
Admission: RE | Admit: 2023-01-26 | Discharge: 2023-01-26 | Disposition: A | Discharge status: Home / Self Care | Payer: 59 | Source: Ambulatory Visit | Attending: Family | Admitting: Family

## 2023-01-26 DIAGNOSIS — Z1231 Encounter for screening mammogram for malignant neoplasm of breast: Secondary | ICD-10-CM | POA: Diagnosis not present

## 2023-01-27 ENCOUNTER — Other Ambulatory Visit: Payer: Self-pay | Admitting: Family

## 2023-01-27 ENCOUNTER — Other Ambulatory Visit (HOSPITAL_BASED_OUTPATIENT_CLINIC_OR_DEPARTMENT_OTHER): Payer: Self-pay

## 2023-01-27 MED ORDER — AMLODIPINE BESYLATE 2.5 MG PO TABS
2.5000 mg | ORAL_TABLET | Freq: Every day | ORAL | 1 refills | Status: DC
  Filled 2023-01-27 – 2023-02-10 (×3): qty 90, 90d supply, fill #0
  Filled 2023-05-09: qty 30, 30d supply, fill #1
  Filled 2023-06-13: qty 30, 30d supply, fill #2
  Filled 2023-07-19: qty 30, 30d supply, fill #3

## 2023-01-27 MED ORDER — CARVEDILOL 12.5 MG PO TABS
12.5000 mg | ORAL_TABLET | Freq: Two times a day (BID) | ORAL | 0 refills | Status: DC
  Filled 2023-01-27 – 2023-02-10 (×3): qty 180, 90d supply, fill #0

## 2023-01-27 MED ORDER — VALSARTAN 320 MG PO TABS
320.0000 mg | ORAL_TABLET | Freq: Every day | ORAL | 1 refills | Status: DC
  Filled 2023-01-27 – 2023-02-10 (×3): qty 90, 90d supply, fill #0
  Filled 2023-05-09: qty 30, 30d supply, fill #1
  Filled 2023-06-13: qty 30, 30d supply, fill #2
  Filled 2023-07-19: qty 30, 30d supply, fill #3

## 2023-02-07 ENCOUNTER — Other Ambulatory Visit (HOSPITAL_BASED_OUTPATIENT_CLINIC_OR_DEPARTMENT_OTHER): Payer: Self-pay

## 2023-02-10 ENCOUNTER — Other Ambulatory Visit (HOSPITAL_COMMUNITY): Payer: Self-pay

## 2023-02-10 ENCOUNTER — Other Ambulatory Visit (HOSPITAL_BASED_OUTPATIENT_CLINIC_OR_DEPARTMENT_OTHER): Payer: Self-pay

## 2023-03-18 ENCOUNTER — Telehealth: Payer: 59 | Admitting: Nurse Practitioner

## 2023-03-18 ENCOUNTER — Other Ambulatory Visit: Payer: Self-pay | Admitting: Nurse Practitioner

## 2023-03-18 DIAGNOSIS — R399 Unspecified symptoms and signs involving the genitourinary system: Secondary | ICD-10-CM

## 2023-03-18 MED ORDER — CEPHALEXIN 500 MG PO CAPS: 500.0000 mg | ORAL_CAPSULE | Freq: Two times a day (BID) | ORAL | 0 refills | Status: DC

## 2023-03-18 NOTE — Progress Notes (Signed)
 I have spent 5 minutes in review of e-visit questionnaire, review and updating patient chart, medical decision making and response to patient.   Claiborne Rigg, NP

## 2023-03-18 NOTE — Progress Notes (Signed)

## 2023-03-20 ENCOUNTER — Telehealth (INDEPENDENT_AMBULATORY_CARE_PROVIDER_SITE_OTHER): Payer: 59 | Admitting: Family Medicine

## 2023-03-20 ENCOUNTER — Other Ambulatory Visit (HOSPITAL_BASED_OUTPATIENT_CLINIC_OR_DEPARTMENT_OTHER): Payer: Self-pay

## 2023-03-20 ENCOUNTER — Encounter: Payer: Self-pay | Admitting: Family Medicine

## 2023-03-20 DIAGNOSIS — R300 Dysuria: Secondary | ICD-10-CM | POA: Diagnosis not present

## 2023-03-20 MED ORDER — SULFAMETHOXAZOLE-TRIMETHOPRIM 800-160 MG PO TABS: 1.0000 | ORAL_TABLET | Freq: Two times a day (BID) | ORAL | 0 refills | Status: AC

## 2023-03-20 NOTE — Progress Notes (Signed)
 Chief Complaint  Patient presents with   Follow-up    Follow up UTI    Bridget Rodriguez is a 43 y.o. female here for possible UTI. We are interacting via web portal for an electronic face-to-face visit. I verified patient's ID using 2 identifiers. Patient agreed to proceed with visit via this method. Patient is a passenger in a car, I am at office. Patient and I are present for visit.   Duration: 3 days. Symptoms: Dysuria, urinary frequency, urinary hesitancy, urinary retention, chills, and urgency Denies: hematuria, fever, nausea, vaginal discharge Hx of recurrent UTI? No Took Keflex  500 mg bid started 2 d ago- helped a little with dysuria Denies new sexual partners.  Past Medical History:  Diagnosis Date   Eczema 10/28/2016   Essential hypertension 10/28/2016   Fatigue 06/26/2020   Fatty liver    Microcytosis 06/26/2020   Pelvic mass in female 09/30/2020   Preventative health care 12/23/2020    Objective No conversational dyspnea Age appropriate judgment and insight Nml affect and mood  Dysuria  Stop Keflex . Start Bactrim  bid for 3 d (has had tubal ligation). Send message if no better in 2-3 d, will have her come in to leave a urine sample. Stay hydrated. Seek immediate care if pt starts to develop fevers, new/worsening symptoms, uncontrollable N/V. F/u prn. The patient voiced understanding and agreement to the plan.  Shellie Dials Vowinckel, DO 03/20/23 7:43 AM

## 2023-04-28 ENCOUNTER — Encounter (HOSPITAL_BASED_OUTPATIENT_CLINIC_OR_DEPARTMENT_OTHER): Payer: Self-pay | Admitting: Emergency Medicine

## 2023-04-28 ENCOUNTER — Other Ambulatory Visit: Payer: Self-pay

## 2023-04-28 ENCOUNTER — Emergency Department (HOSPITAL_BASED_OUTPATIENT_CLINIC_OR_DEPARTMENT_OTHER)
Admission: EM | Admit: 2023-04-28 | Discharge: 2023-04-29 | Disposition: A | Discharge status: Home / Self Care | Attending: Emergency Medicine | Admitting: Emergency Medicine

## 2023-04-28 DIAGNOSIS — R42 Dizziness and giddiness: Secondary | ICD-10-CM | POA: Insufficient documentation

## 2023-04-28 DIAGNOSIS — Z79899 Other long term (current) drug therapy: Secondary | ICD-10-CM | POA: Insufficient documentation

## 2023-04-28 DIAGNOSIS — E876 Hypokalemia: Secondary | ICD-10-CM | POA: Diagnosis not present

## 2023-04-28 DIAGNOSIS — I10 Essential (primary) hypertension: Secondary | ICD-10-CM | POA: Insufficient documentation

## 2023-04-28 LAB — BASIC METABOLIC PANEL
Anion gap: 8 (ref 5–15)
BUN: 18 mg/dL (ref 6–20)
CO2: 25 mmol/L (ref 22–32)
Calcium: 9 mg/dL (ref 8.9–10.3)
Chloride: 104 mmol/L (ref 98–111)
Creatinine, Ser: 0.63 mg/dL (ref 0.44–1.00)
GFR, Estimated: >60 mL/min (ref >60)
Glucose, Bld: 113 mg/dL — ABNORMAL HIGH (ref 70–99)
Potassium: 3.3 mmol/L — ABNORMAL LOW (ref 3.5–5.1)
Sodium: 137 mmol/L (ref 135–145)

## 2023-04-28 LAB — CBC
HCT: 41.8 % (ref 36.0–46.0)
Hemoglobin: 13.1 g/dL (ref 12.0–15.0)
MCH: 20.3 pg — ABNORMAL LOW (ref 26.0–34.0)
MCHC: 31.3 g/dL (ref 30.0–36.0)
MCV: 64.9 fL — ABNORMAL LOW (ref 80.0–100.0)
Platelets: 276 10*3/uL (ref 150–400)
RBC: 6.44 MIL/uL — ABNORMAL HIGH (ref 3.87–5.11)
RDW: 15.2 % (ref 11.5–15.5)
WBC: 8.2 10*3/uL (ref 4.0–10.5)
nRBC: 0 % (ref 0.0–0.2)

## 2023-04-28 LAB — CBG MONITORING, ED: Glucose-Capillary: 114 mg/dL — ABNORMAL HIGH (ref 70–99)

## 2023-04-28 NOTE — ED Notes (Signed)
 Urine cup given, pt to bathroom to void

## 2023-04-29 LAB — URINALYSIS, ROUTINE W REFLEX MICROSCOPIC
Bilirubin Urine: NEGATIVE
Glucose, UA: NEGATIVE mg/dL
Hgb urine dipstick: NEGATIVE
Ketones, ur: NEGATIVE mg/dL
Leukocytes,Ua: NEGATIVE
Nitrite: NEGATIVE
Protein, ur: NEGATIVE mg/dL
Specific Gravity, Urine: 1.015 (ref 1.005–1.030)
pH: 7 (ref 5.0–8.0)

## 2023-04-29 LAB — PREGNANCY, URINE: Preg Test, Ur: NEGATIVE

## 2023-04-29 MED ORDER — POTASSIUM CHLORIDE 20 MEQ PO PACK
40.0000 meq | PACK | Freq: Once | ORAL | Status: AC
  Administered 2023-04-29: 40 meq via ORAL
  Filled 2023-04-29: qty 2

## 2023-04-29 MED ORDER — SODIUM CHLORIDE 0.9 % IV BOLUS
1000.0000 mL | Freq: Once | INTRAVENOUS | Status: AC
  Administered 2023-04-29: 1000 mL via INTRAVENOUS

## 2023-04-29 MED ORDER — MECLIZINE HCL 25 MG PO TABS
50.0000 mg | ORAL_TABLET | Freq: Once | ORAL | Status: AC
  Administered 2023-04-29: 50 mg via ORAL
  Filled 2023-04-29: qty 2

## 2023-04-29 MED ORDER — MAGNESIUM OXIDE -MG SUPPLEMENT 400 (240 MG) MG PO TABS
400.0000 mg | ORAL_TABLET | Freq: Once | ORAL | Status: AC
  Administered 2023-04-29: 400 mg via ORAL
  Filled 2023-04-29: qty 1

## 2023-04-29 MED ORDER — MECLIZINE HCL 25 MG PO TABS: 25.0000 mg | ORAL_TABLET | Freq: Three times a day (TID) | ORAL | 0 refills | Status: AC | PRN

## 2023-04-29 NOTE — ED Provider Notes (Signed)
 [facility] Provider Note  CSN: [id number] Arrival date & time: 04/28/23 2110  Chief Complaint(s) Dizziness and Headache  HPI Bridget Rodriguez is a 43 y.o. female with past medical history as below, significant for hypertension, fatigue, eczema, who presents to the ED with complaint of dizziness  Patient reports feeling lightheaded/dizzy over the past week, intermittent, seems to worsen with exertion or ambulation.  Head turning.  Reports a room spinning sensation.  Denies chest pain, palpitations, dyspnea, nausea or vomiting.  No numbness or weakness, no changes to hearing or vision.  No gait disturbance.  No falls or head injuries.  Denies headache.  Does report that she has been dieting over the past 2 weeks, reduced p.o. intake secondary to dieting.  She has been compliant with her home medications.  Reports similar symptoms in the past that were attributed to dehydration/excessive fatigue per the patient  Past Medical History Past Medical History:  Diagnosis Date   Eczema 10/28/2016   Essential hypertension 10/28/2016   Fatigue 06/26/2020   Fatty liver    Microcytosis 06/26/2020   Pelvic mass in female 09/30/2020   Preventative health care 12/23/2020   Patient Active Problem List   Diagnosis Date Noted   Obesity (BMI 30.0-34.9) 01/10/2023   Pelvic mass in female 04/12/2022   Post-operative state 04/12/2022   Acute cystitis without hematuria 02/15/2022   Preventative health care 12/23/2020   Pelvic mass 09/30/2020   Microcytosis 06/26/2020   Fatigue 06/26/2020   Fatty liver    Essential hypertension 10/28/2016   Eczema 10/28/2016   Home Medication(s) Prior to Admission medications   Medication Sig Start Date End Date Taking? Authorizing Provider  meclizine (ANTIVERT) 25 MG tablet Take 1 tablet (25 mg total) by mouth 3 (three) times daily as needed for dizziness. 04/29/23  Yes Tanda Rockers A, DO  amLODipine (NORVASC) 2.5 MG  tablet Take 1 tablet (2.5 mg total) by mouth daily. 01/27/23   Sandford Craze, NP  carvedilol (COREG) 12.5 MG tablet Take 1 tablet (12.5 mg total) by mouth 2 (two) times daily. 01/27/23   Sandford Craze, NP  valsartan (DIOVAN) 320 MG tablet Take 1 tablet (320 mg total) by mouth daily. 01/27/23   Sandford Craze, NP                                                                                                                                    Past Surgical History Past Surgical History:  Procedure Laterality Date   CESAREAN SECTION  2009   CESAREAN SECTION WITH BILATERAL TUBAL LIGATION Bilateral 2017   CYSTOSCOPY  04/12/2022   Procedure: CYSTOSCOPY;  Surgeon: Willodean Rosenthal, MD;  Location: [facility] OR;  Service: Gynecology;;   DIAGNOSTIC LAPAROSCOPY     LAPAROTOMY Right 04/12/2022   Procedure: LAPAROTOMY WITH POSSIBLE RIGHT SALPINGO-OOPHORECTOMY;  Surgeon: Willodean Rosenthal, MD;  Location: [facility] OR;  Service: Gynecology;  Laterality: Right;  Family History Family History  Problem Relation Age of Onset   Hypertension Father     Social History Social History   Tobacco Use   Smoking status: Never   Smokeless tobacco: Never  Vaping Use   Vaping status: Never Used  Substance Use Topics   Alcohol use: No   Drug use: No   Allergies Patient has no known allergies.  Review of Systems A thorough review of systems was obtained and all systems are negative except as noted in the HPI and PMH.   Physical Exam Vital Signs  I have reviewed the triage vital signs BP (!) 148/93   Pulse 66   Temp 98.1 F (36.7 C) (Oral)   Resp 18   Ht 4\' 10"  (1.473 m)   Wt 72.6 kg   LMP 04/06/2023 (Approximate)   SpO2 100%   BMI 33.44 kg/m  Physical Exam Vitals and nursing note reviewed.  Constitutional:      General: She is not in acute distress.    Appearance: Normal appearance.  HENT:     Head: Normocephalic and atraumatic.     Right Ear: External ear normal.     Left  Ear: External ear normal.     Nose: Nose normal.     Mouth/Throat:     Mouth: Mucous membranes are moist.  Eyes:     General: No scleral icterus.       Right eye: No discharge.        Left eye: No discharge.     Pupils: Pupils are equal, round, and reactive to light.  Cardiovascular:     Rate and Rhythm: Normal rate and regular rhythm.     Pulses: Normal pulses.     Heart sounds: Normal heart sounds.  Pulmonary:     Effort: Pulmonary effort is normal. No respiratory distress.     Breath sounds: Normal breath sounds. No stridor.  Abdominal:     General: Abdomen is flat. There is no distension.     Palpations: Abdomen is soft.     Tenderness: There is no abdominal tenderness.  Musculoskeletal:     Cervical back: No rigidity.     Right lower leg: No edema.     Left lower leg: No edema.  Skin:    General: Skin is warm and dry.     Capillary Refill: Capillary refill takes less than 2 seconds.  Neurological:     Mental Status: She is alert and oriented to person, place, and time.     GCS: GCS eye subscore is 4. GCS verbal subscore is 5. GCS motor subscore is 6.     Cranial Nerves: Cranial nerves 2-12 are intact. No dysarthria or facial asymmetry.     Sensory: Sensation is intact. No sensory deficit.     Motor: Motor function is intact. No tremor or pronator drift.     Coordination: Coordination is intact. Finger-Nose-Finger Test normal.     Gait: Gait is intact.     Comments: Strength 5/5 to BLUE/BLLE, equal and symmetric    Psychiatric:        Mood and Affect: Mood normal.        Behavior: Behavior normal. Behavior is cooperative.     ED Results and Treatments Labs (all labs ordered are listed, but only abnormal results are displayed) Labs Reviewed  BASIC METABOLIC PANEL - Abnormal; Notable for the following components:      Result Value   Potassium 3.3 (*)    Glucose, Bld  113 (*)    All other components within normal limits  CBC - Abnormal; Notable for the following  components:   RBC 6.44 (*)    MCV 64.9 (*)    MCH 20.3 (*)    All other components within normal limits  CBG MONITORING, ED - Abnormal; Notable for the following components:   Glucose-Capillary 114 (*)    All other components within normal limits  PREGNANCY, URINE  URINALYSIS, ROUTINE W REFLEX MICROSCOPIC                                                                                                                          Radiology No results found.  Pertinent labs & imaging results that were available during my care of the patient were reviewed by me and considered in my medical decision making (see MDM for details).  Medications Ordered in ED Medications  sodium chloride 0.9 % bolus 1,000 mL (1,000 mLs Intravenous New Bag/Given 04/29/23 0100)  meclizine (ANTIVERT) tablet 50 mg (50 mg Oral Given 04/29/23 0104)  magnesium oxide (MAG-OX) tablet 400 mg (400 mg Oral Given 04/29/23 0104)  potassium chloride (KLOR-CON) packet 40 mEq (40 mEq Oral Given 04/29/23 0103)                                                                                                                                     Procedures Procedures  (including critical care time)  Medical Decision Making / ED Course    Medical Decision Making:    AISHI COURTS is a 43 y.o. female with past medical history as below, significant for hypertension, fatigue, eczema, who presents to the ED with complaint of dizziness. The complaint involves an extensive differential diagnosis and also carries with it a high risk of complications and morbidity.  Serious etiology was considered. Ddx includes but is not limited to: Dehydration, metabolic derangement, electrolyte disturbance, vertigo peripheral versus central, arrhythmia, etc.  Complete initial physical exam performed, notably the patient was in no distress, sitting comfortably on stretcher.    Reviewed and confirmed nursing documentation for past medical history, family  history, social history.  Vital signs reviewed.      Clinical Course as of 04/29/23 0306  Sat Apr 29, 2023  0156 Feeling better on recheck, steady ambulation to restroom [SG]    Clinical Course User Index [SG] Sloan Leiter, DO  Brief summary: 43 year old female with history as above here with lightheadedness, dizziness. She is not currently having any symptoms that she is sitting down.  Screening labs obtained in triage reviewed, potassium is low, replaced orally. Give IV fluids, Antivert,  in addition to electrolyte replacement.  On recheck she is feeling much better  Patient presents with vertigo. On initial evaluation patient appears in no acute distress, afebrile with normal vital signs. Vertigo most suggestive of peripheral cause. Neuro intact without sign of CNS ischemia or other serious etiology. DC on Meclizine with close PCP F/U. Warnings discussed.   Patient in no distress and overall condition improved here in the ED. Detailed discussions were had with the patient regarding current findings, and need for close f/u with PCP or on call doctor. The patient has been instructed to return immediately if the symptoms worsen in any way for re-evaluation. Patient verbalized understanding and is in agreement with current care plan. All questions answered prior to discharge.             Additional history obtained: -Additional history obtained from spouse -External records from outside source obtained and reviewed including: Chart review including previous notes, labs, imaging, consultation notes including  Primary care documentation, home medications   Lab Tests: -I ordered, reviewed, and interpreted labs.   The pertinent results include:   Labs Reviewed  BASIC METABOLIC PANEL - Abnormal; Notable for the following components:      Result Value   Potassium 3.3 (*)    Glucose, Bld 113 (*)    All other components within normal limits  CBC - Abnormal; Notable for  the following components:   RBC 6.44 (*)    MCV 64.9 (*)    MCH 20.3 (*)    All other components within normal limits  CBG MONITORING, ED - Abnormal; Notable for the following components:   Glucose-Capillary 114 (*)    All other components within normal limits  PREGNANCY, URINE  URINALYSIS, ROUTINE W REFLEX MICROSCOPIC    Notable for mild hypokalemia  EKG   EKG Interpretation Date/Time:  Friday April 28 2023 21:21:28 EDT Ventricular Rate:  64 PR Interval:  178 QRS Duration:  86 QT Interval:  410 QTC Calculation: 422 R Axis:   89  Text Interpretation: Normal sinus rhythm Normal ECG When compared with ECG of 21-Jun-2020 23:20, PREVIOUS ECG IS PRESENT no stemi Confirmed by Tanda Rockers ([id number]) on 04/29/2023 12:57:56 AM         Imaging Studies ordered: na   Medicines ordered and prescription drug management: Meds ordered this encounter  Medications   sodium chloride 0.9 % bolus 1,000 mL   meclizine (ANTIVERT) tablet 50 mg   magnesium oxide (MAG-OX) tablet 400 mg   potassium chloride (KLOR-CON) packet 40 mEq   meclizine (ANTIVERT) 25 MG tablet    Sig: Take 1 tablet (25 mg total) by mouth 3 (three) times daily as needed for dizziness.    Dispense:  30 tablet    Refill:  0    -I have reviewed the patients home medicines and have made adjustments as needed   Consultations Obtained: na   Cardiac Monitoring: Continuous pulse oximetry interpreted by myself, 100% on RA.    Social Determinants of Health:  Diagnosis or treatment significantly limited by social determinants of health: obesity   Reevaluation: After the interventions noted above, I reevaluated the patient and found that they have resolved  Co morbidities that complicate the patient evaluation  Past Medical History:  Diagnosis  Date   Eczema 10/28/2016   Essential hypertension 10/28/2016   Fatigue 06/26/2020   Fatty liver    Microcytosis 06/26/2020   Pelvic mass in female 09/30/2020   Preventative  health care 12/23/2020      Dispostion: Disposition decision including need for hospitalization was considered, and patient discharged from emergency department.    Final Clinical Impression(s) / ED Diagnoses Final diagnoses:  Vertigo  Hypokalemia        Sloan Leiter, DO 04/29/23 [id number]

## 2023-04-29 NOTE — Discharge Instructions (Signed)
 It was a pleasure caring for you today in the emergency department.  Be sure to get plenty of rest over the next few days, drink plenty of fluids.  Please follow-up with your PCP in the next week for recheck.  If symptoms persist may benefit from evaluation by ENT.  Please return to the emergency department for any worsening or worrisome symptoms.

## 2023-05-01 ENCOUNTER — Ambulatory Visit: Admitting: Family Medicine

## 2023-05-02 ENCOUNTER — Ambulatory Visit: Admitting: Family Medicine

## 2023-05-09 ENCOUNTER — Other Ambulatory Visit (HOSPITAL_BASED_OUTPATIENT_CLINIC_OR_DEPARTMENT_OTHER): Payer: Self-pay

## 2023-05-09 ENCOUNTER — Other Ambulatory Visit: Payer: Self-pay | Admitting: Family

## 2023-05-09 MED ORDER — CARVEDILOL 12.5 MG PO TABS
12.5000 mg | ORAL_TABLET | Freq: Two times a day (BID) | ORAL | 0 refills | Status: DC
  Filled 2023-05-09 – 2023-05-10 (×2): qty 60, 30d supply, fill #0
  Filled 2023-06-13: qty 60, 30d supply, fill #1
  Filled 2023-07-19: qty 60, 30d supply, fill #2

## 2023-05-10 ENCOUNTER — Other Ambulatory Visit (HOSPITAL_BASED_OUTPATIENT_CLINIC_OR_DEPARTMENT_OTHER): Payer: Self-pay

## 2023-05-15 ENCOUNTER — Other Ambulatory Visit: Payer: Self-pay | Admitting: Family

## 2023-05-15 DIAGNOSIS — Z1211 Encounter for screening for malignant neoplasm of colon: Secondary | ICD-10-CM

## 2023-05-15 NOTE — Progress Notes (Signed)
 See mychart.

## 2023-07-04 ENCOUNTER — Encounter: Payer: Self-pay | Admitting: Family

## 2023-07-19 ENCOUNTER — Other Ambulatory Visit (HOSPITAL_BASED_OUTPATIENT_CLINIC_OR_DEPARTMENT_OTHER): Payer: Self-pay

## 2023-08-12 ENCOUNTER — Other Ambulatory Visit: Payer: Self-pay | Admitting: Family

## 2023-08-14 ENCOUNTER — Other Ambulatory Visit (HOSPITAL_BASED_OUTPATIENT_CLINIC_OR_DEPARTMENT_OTHER): Payer: Self-pay

## 2023-08-14 MED ORDER — AMLODIPINE BESYLATE 2.5 MG PO TABS
2.5000 mg | ORAL_TABLET | Freq: Every day | ORAL | 1 refills | Status: DC
  Filled 2023-08-14: qty 30, 30d supply, fill #0
  Filled 2023-09-15: qty 30, 30d supply, fill #1

## 2023-08-14 MED ORDER — CARVEDILOL 12.5 MG PO TABS
12.5000 mg | ORAL_TABLET | Freq: Two times a day (BID) | ORAL | 0 refills | Status: DC
  Filled 2023-08-14: qty 60, 30d supply, fill #0
  Filled 2023-09-15: qty 60, 30d supply, fill #1

## 2023-08-14 MED ORDER — VALSARTAN 320 MG PO TABS
320.0000 mg | ORAL_TABLET | Freq: Every day | ORAL | 1 refills | Status: DC
  Filled 2023-08-14: qty 30, 30d supply, fill #0
  Filled 2023-09-15: qty 30, 30d supply, fill #1

## 2023-08-14 NOTE — Telephone Encounter (Signed)
 Please contact pt to schedule follow up.

## 2023-08-15 ENCOUNTER — Other Ambulatory Visit (HOSPITAL_BASED_OUTPATIENT_CLINIC_OR_DEPARTMENT_OTHER): Payer: Self-pay

## 2023-09-26 ENCOUNTER — Ambulatory Visit: Admitting: Family

## 2023-09-26 VITALS — BP 120/82 | HR 73 | Temp 98.6°F | Resp 12 | Ht <= 58 in | Wt 163.2 lb

## 2023-09-26 DIAGNOSIS — E785 Hyperlipidemia, unspecified: Secondary | ICD-10-CM | POA: Diagnosis not present

## 2023-09-26 DIAGNOSIS — Z1211 Encounter for screening for malignant neoplasm of colon: Secondary | ICD-10-CM | POA: Insufficient documentation

## 2023-09-26 DIAGNOSIS — I10 Essential (primary) hypertension: Secondary | ICD-10-CM | POA: Diagnosis not present

## 2023-09-26 MED ORDER — CARVEDILOL 12.5 MG PO TABS
12.5000 mg | ORAL_TABLET | Freq: Two times a day (BID) | ORAL | 0 refills | Status: DC
Start: 2023-09-26 — End: 2023-10-19

## 2023-09-26 NOTE — Assessment & Plan Note (Signed)
 Initial bp a bit high upon arrival, but home readings have been good including home reading this AM 120/82.  Will continue current meds. She holds her carvedilol  if HR <60 and amlodipine  if SBP< 100- both of which are rare. I advised her OK to continue same.

## 2023-09-26 NOTE — Assessment & Plan Note (Signed)
 Declines colonoscopy but agreeable to cologuard.

## 2023-09-26 NOTE — Progress Notes (Signed)
 Subjective:     Patient ID: Bridget Rodriguez, female    DOB: 1980/09/06, 43 y.o.   MRN: [id number]  Chief Complaint  Patient presents with   Follow-up    HPI  Discussed the use of AI scribe software for clinical note transcription with the patient, who gave verbal consent to proceed.  History of Present Illness   Bridget Rodriguez is a 43 year old female with hypertension who presents for a follow-up on her medications.  She experiences fluctuations in blood pressure, with readings as low as 110/70 mmHg, causing dizziness. She occasionally skips amlodipine  when her blood pressure is low, around 100 mmHg. Her heart rate has dropped to 52 beats per minute, leading her to skip carvedilol  when her heart rate is below 60 bpm. This morning, her blood pressure was 120/82 mmHg with a heart rate of 76 bpm. The highest recent blood pressure reading was 138/92 mmHg.  She is focusing on a healthier diet, emphasizing fish and vegetables while avoiding meat, particularly pork. Her weight has fluctuated, reaching 159 pounds before a recent vacation, after which it increased.  Her potassium levels were slightly low during the last lab check.    Health Maintenance Due  Topic Date Due   Hepatitis B Vaccines 19-59 Average Risk (1 of 3 - 19+ 3-dose series) Never done   HPV VACCINES (1 - 3-dose SCDM series) Never done   Colonoscopy  Never done    Past Medical History:  Diagnosis Date   Eczema 10/28/2016   Essential hypertension 10/28/2016   Fatigue 06/26/2020   Fatty liver    Microcytosis 06/26/2020   Pelvic mass in female 09/30/2020   Preventative health care 12/23/2020    Past Surgical History:  Procedure Laterality Date   CESAREAN SECTION  2009   CESAREAN SECTION WITH BILATERAL TUBAL LIGATION Bilateral 2017   CYSTOSCOPY  04/12/2022   Procedure: CYSTOSCOPY;  Surgeon: Corene Coy, MD;  Location: [facility] OR;  Service: Gynecology;;   DIAGNOSTIC LAPAROSCOPY     LAPAROTOMY Right  04/12/2022   Procedure: LAPAROTOMY WITH POSSIBLE RIGHT SALPINGO-OOPHORECTOMY;  Surgeon: Corene Coy, MD;  Location: [facility] OR;  Service: Gynecology;  Laterality: Right;    Family History  Problem Relation Age of Onset   Hypertension Father     Social History   Socioeconomic History   Marital status: Married    Spouse name: Kathlyne   Number of children: Not on file   Years of education: Not on file   Highest education level: Bachelor's degree (e.g., BA, AB, BS)  Occupational History   Not on file  Tobacco Use   Smoking status: Never   Smokeless tobacco: Never  Vaping Use   Vaping status: Never Used  Substance and Sexual Activity   Alcohol use: No   Drug use: No   Sexual activity: Yes    Birth control/protection: Surgical  Other Topics Concern   Not on file  Social History Narrative   2 children    2017- daughter- butler   2009- son- Franky   Married   Works as an Charity fundraiser at [employer]    Enjoys restaurants, visiting local sites   Parents and live locally.    Social Drivers of Health   Financial Resource Strain: Low Risk  (09/26/2023)   Overall Financial Resource Strain (CARDIA)    Difficulty of Paying Living Expenses: Not very hard  Food Insecurity: No Food Insecurity (09/26/2023)   Hunger Vital Sign    Worried About Running  Out of Food in the Last Year: Never true    Ran Out of Food in the Last Year: Never true  Transportation Needs: No Transportation Needs (09/26/2023)   PRAPARE - Administrator, Civil Service (Medical): No    Lack of Transportation (Non-Medical): No  Physical Activity: Insufficiently Active (09/26/2023)   Exercise Vital Sign    Days of Exercise per Week: 2 days    Minutes of Exercise per Session: 10 min  Stress: No Stress Concern Present (09/26/2023)   [employer] of Occupational Health - Occupational Stress Questionnaire    Feeling of Stress: Not at all  Social Connections: Moderately Integrated (09/26/2023)   Social  Connection and Isolation Panel    Frequency of Communication with Friends and Family: More than three times a week    Frequency of Social Gatherings with Friends and Family: More than three times a week    Attends Religious Services: More than 4 times per year    Active Member of [employer] or Organizations: No    Attends Banker Meetings: Not on file    Marital Status: Married  Catering manager Violence: Not At Risk (04/12/2022)   Humiliation, Afraid, Rape, and Kick questionnaire    Fear of Current or Ex-Partner: No    Emotionally Abused: No    Physically Abused: No    Sexually Abused: No    Outpatient Medications Prior to Visit  Medication Sig Dispense Refill   amLODipine  (NORVASC ) 2.5 MG tablet Take 1 tablet (2.5 mg total) by mouth daily. 90 tablet 1   meclizine  (ANTIVERT ) 25 MG tablet Take 1 tablet (25 mg total) by mouth 3 (three) times daily as needed for dizziness. 30 tablet 0   valsartan  (DIOVAN ) 320 MG tablet Take 1 tablet (320 mg total) by mouth daily. 90 tablet 1   carvedilol  (COREG ) 12.5 MG tablet Take 1 tablet (12.5 mg total) by mouth 2 (two) times daily. 180 tablet 0   No facility-administered medications prior to visit.    No Known Allergies  ROS See HPI    Objective:    Physical Exam Constitutional:      Appearance: She is well-developed.  Cardiovascular:     Rate and Rhythm: Normal rate and regular rhythm.     Heart sounds: Normal heart sounds. No murmur heard. Pulmonary:     Effort: Pulmonary effort is normal. No respiratory distress.     Breath sounds: Normal breath sounds. No wheezing.  Psychiatric:        Behavior: Behavior normal.        Thought Content: Thought content normal.        Judgment: Judgment normal.      BP 120/82 Comment: home reading this morning at home  Pulse 73   Temp 98.6 F (37 C) (Oral)   Resp 12   Ht 4' 10 (1.473 m)   Wt 163 lb 3.2 oz (74 kg)   SpO2 99%   BMI 34.11 kg/m  Wt Readings from Last 3 Encounters:   09/26/23 163 lb 3.2 oz (74 kg)  04/28/23 160 lb (72.6 kg)  01/10/23 163 lb (73.9 kg)       Assessment & Plan:   Problem List Items Addressed This Visit       Unprioritized   Screening for colon cancer   Declines colonoscopy but agreeable to cologuard.      Relevant Orders   Cologuard   Essential hypertension   Initial bp a bit high upon arrival,  but home readings have been good including home reading this AM 120/82.  Will continue current meds. She holds her carvedilol  if HR <60 and amlodipine  if SBP< 100- both of which are rare. I advised her OK to continue same.       Relevant Medications   carvedilol  (COREG ) 12.5 MG tablet   Other Visit Diagnoses       Hyperlipidemia, unspecified hyperlipidemia type    -  Primary   Relevant Medications   carvedilol  (COREG ) 12.5 MG tablet   Other Relevant Orders   Comp Met (CMET)   Lipid panel       I am having Camala D. Swartzlander maintain her meclizine , valsartan , amLODipine , and carvedilol .  Meds ordered this encounter  Medications   carvedilol  (COREG ) 12.5 MG tablet    Sig: Take 1 tablet (12.5 mg total) by mouth 2 (two) times daily.    Dispense:  180 tablet    Refill:  0    Supervising Provider:   DOMENICA BLACKBIRD A [[id number]]

## 2023-09-26 NOTE — Patient Instructions (Signed)
 VISIT SUMMARY:  Today, we reviewed your blood pressure management, discussed your recent symptoms, and planned for follow-up lab work and screenings.  YOUR PLAN:  HYPERTENSION: Your blood pressure is generally well-controlled, but you have experienced occasional low readings and a low heart rate. -Continue taking amlodipine , carvedilol , and Diovan  as prescribed. -Monitor your blood pressure regularly and adjust your medication as needed. -Your carvedilol  prescription has been refilled and sent to the new pharmacy on [address].  HYPOKALEMIA: Your potassium levels were slightly low during your last lab check. -Schedule a lab appointment to recheck your potassium levels.  HYPERLIPIDEMIA: We need to monitor your cholesterol levels. -Schedule a lab appointment to check your cholesterol levels.  GENERAL HEALTH MAINTENANCE: We discussed colon cancer screening options. -We agreed on the Cologuard test due to your recent surgery. The test will be mailed to your home.  FOLLOW-UP: Routine follow-up and lab work are planned. -Schedule a physical exam in December. -Schedule a lab appointment in the next week or two, preferably fasting.

## 2023-10-12 ENCOUNTER — Ambulatory Visit: Payer: Self-pay | Admitting: Family

## 2023-10-12 ENCOUNTER — Other Ambulatory Visit (INDEPENDENT_AMBULATORY_CARE_PROVIDER_SITE_OTHER)

## 2023-10-12 DIAGNOSIS — E785 Hyperlipidemia, unspecified: Secondary | ICD-10-CM | POA: Diagnosis not present

## 2023-10-12 DIAGNOSIS — I10 Essential (primary) hypertension: Secondary | ICD-10-CM

## 2023-10-12 LAB — COMPREHENSIVE METABOLIC PANEL WITH GFR
ALT: 23 U/L (ref 0–35)
AST: 16 U/L (ref 0–37)
Albumin: 4.4 g/dL (ref 3.5–5.2)
Alkaline Phosphatase: 75 U/L (ref 39–117)
BUN: 20 mg/dL (ref 6–23)
CO2: 27 meq/L (ref 19–32)
Calcium: 9 mg/dL (ref 8.4–10.5)
Chloride: 103 meq/L (ref 96–112)
Creatinine, Ser: 0.72 mg/dL (ref 0.40–1.20)
GFR: 101 mL/min (ref >60.00)
Glucose, Bld: 86 mg/dL (ref 70–99)
Potassium: 4.2 meq/L (ref 3.5–5.1)
Sodium: 138 meq/L (ref 135–145)
Total Bilirubin: 0.5 mg/dL (ref 0.2–1.2)
Total Protein: 7.5 g/dL (ref 6.0–8.3)

## 2023-10-12 LAB — LIPID PANEL
Cholesterol: 192 mg/dL (ref 0–200)
HDL: 58.2 mg/dL (ref >39.00)
LDL Cholesterol: 97 mg/dL (ref 0–99)
NonHDL: 134.11
Total CHOL/HDL Ratio: 3
Triglycerides: 188 mg/dL — ABNORMAL HIGH (ref 0.0–149.0)
VLDL: 37.6 mg/dL (ref 0.0–40.0)

## 2023-10-18 LAB — COLOGUARD: COLOGUARD: NEGATIVE

## 2023-10-19 MED ORDER — AMLODIPINE BESYLATE 2.5 MG PO TABS: 2.5000 mg | ORAL_TABLET | Freq: Every day | ORAL | 1 refills | Status: AC

## 2023-10-19 MED ORDER — VALSARTAN 320 MG PO TABS: 320.0000 mg | ORAL_TABLET | Freq: Every day | ORAL | 1 refills | Status: AC

## 2023-10-19 MED ORDER — CARVEDILOL 12.5 MG PO TABS: 12.5000 mg | ORAL_TABLET | Freq: Two times a day (BID) | ORAL | 1 refills | Status: AC

## 2024-01-12 ENCOUNTER — Encounter: Admitting: Family

## 2024-02-23 ENCOUNTER — Encounter: Admitting: Family

## 2024-03-27 ENCOUNTER — Encounter: Admitting: Family
